# Patient Record
Sex: Female | Born: 1950
Health system: Southern US, Community
[De-identification: ages and names within clinical notes are randomized; demographics above are authoritative.]

## PROBLEM LIST (undated history)

## (undated) DIAGNOSIS — M797 Fibromyalgia: Secondary | ICD-10-CM

## (undated) DIAGNOSIS — H269 Unspecified cataract: Secondary | ICD-10-CM

## (undated) DIAGNOSIS — I10 Essential (primary) hypertension: Secondary | ICD-10-CM

## (undated) DIAGNOSIS — Q245 Malformation of coronary vessels: Secondary | ICD-10-CM

## (undated) DIAGNOSIS — K635 Polyp of colon: Secondary | ICD-10-CM

## (undated) DIAGNOSIS — I341 Nonrheumatic mitral (valve) prolapse: Secondary | ICD-10-CM

## (undated) DIAGNOSIS — K449 Diaphragmatic hernia without obstruction or gangrene: Secondary | ICD-10-CM

## (undated) DIAGNOSIS — F419 Anxiety disorder, unspecified: Secondary | ICD-10-CM

## (undated) DIAGNOSIS — K219 Gastro-esophageal reflux disease without esophagitis: Secondary | ICD-10-CM

## (undated) DIAGNOSIS — E785 Hyperlipidemia, unspecified: Secondary | ICD-10-CM

## (undated) DIAGNOSIS — E669 Obesity, unspecified: Secondary | ICD-10-CM

## (undated) DIAGNOSIS — K7689 Other specified diseases of liver: Secondary | ICD-10-CM

## (undated) DIAGNOSIS — E041 Nontoxic single thyroid nodule: Secondary | ICD-10-CM

## (undated) DIAGNOSIS — M069 Rheumatoid arthritis, unspecified: Secondary | ICD-10-CM

## (undated) DIAGNOSIS — M1990 Unspecified osteoarthritis, unspecified site: Secondary | ICD-10-CM

## (undated) HISTORY — DX: Diaphragmatic hernia without obstruction or gangrene: K44.9

## (undated) HISTORY — DX: Other specified diseases of liver: K76.89

## (undated) HISTORY — PX: TUBAL LIGATION: SHX77

## (undated) HISTORY — DX: Hyperlipidemia, unspecified: E78.5

## (undated) HISTORY — PX: VAGINAL HYSTERECTOMY: SUR661

## (undated) HISTORY — DX: Unspecified cataract: H26.9

## (undated) HISTORY — PX: RHINOPLASTY: SUR1284

## (undated) HISTORY — DX: Malformation of coronary vessels: Q24.5

## (undated) HISTORY — DX: Polyp of colon: K63.5

## (undated) HISTORY — DX: Fibromyalgia: M79.7

## (undated) HISTORY — DX: Essential (primary) hypertension: I10

## (undated) HISTORY — PX: FOOT SURGERY: SHX648

## (undated) HISTORY — DX: Gastro-esophageal reflux disease without esophagitis: K21.9

## (undated) HISTORY — DX: Nontoxic single thyroid nodule: E04.1

## (undated) HISTORY — DX: Obesity, unspecified: E66.9

## (undated) HISTORY — PX: NASAL SEPTUM SURGERY: SHX37

## (undated) HISTORY — DX: Unspecified osteoarthritis, unspecified site: M19.90

## (undated) HISTORY — DX: Nonrheumatic mitral (valve) prolapse: I34.1

## (undated) HISTORY — DX: Anxiety disorder, unspecified: F41.9

## (undated) HISTORY — DX: Rheumatoid arthritis, unspecified: M06.9

---

## 1998-11-22 ENCOUNTER — Ambulatory Visit (HOSPITAL_BASED_OUTPATIENT_CLINIC_OR_DEPARTMENT_OTHER): Admission: RE | Admit: 1998-11-22 | Discharge: 1998-11-22 | Payer: Self-pay | Admitting: Specialist

## 1998-11-27 HISTORY — PX: OTHER SURGICAL HISTORY: SHX169

## 1999-04-29 ENCOUNTER — Other Ambulatory Visit: Admission: RE | Admit: 1999-04-29 | Discharge: 1999-04-29 | Payer: Self-pay | Admitting: Gynecology

## 2000-05-11 ENCOUNTER — Other Ambulatory Visit: Admission: RE | Admit: 2000-05-11 | Discharge: 2000-05-11 | Payer: Self-pay | Admitting: Gynecology

## 2000-09-16 ENCOUNTER — Encounter (INDEPENDENT_AMBULATORY_CARE_PROVIDER_SITE_OTHER): Payer: Self-pay | Admitting: Specialist

## 2000-09-16 ENCOUNTER — Ambulatory Visit (HOSPITAL_COMMUNITY): Admission: RE | Admit: 2000-09-16 | Discharge: 2000-09-16 | Payer: Self-pay | Admitting: Gynecology

## 2000-12-20 ENCOUNTER — Encounter: Payer: Self-pay | Admitting: Gynecology

## 2000-12-27 ENCOUNTER — Inpatient Hospital Stay (HOSPITAL_COMMUNITY): Admission: RE | Admit: 2000-12-27 | Discharge: 2000-12-29 | Payer: Self-pay | Admitting: Gynecology

## 2000-12-27 ENCOUNTER — Encounter (INDEPENDENT_AMBULATORY_CARE_PROVIDER_SITE_OTHER): Payer: Self-pay | Admitting: Specialist

## 2001-05-12 ENCOUNTER — Encounter: Payer: Self-pay | Admitting: Internal Medicine

## 2001-05-12 ENCOUNTER — Encounter: Admission: RE | Admit: 2001-05-12 | Discharge: 2001-05-12 | Payer: Self-pay | Admitting: Internal Medicine

## 2002-08-04 ENCOUNTER — Encounter: Payer: Self-pay | Admitting: Internal Medicine

## 2002-08-04 ENCOUNTER — Encounter: Admission: RE | Admit: 2002-08-04 | Discharge: 2002-08-04 | Payer: Self-pay | Admitting: Internal Medicine

## 2004-04-14 ENCOUNTER — Other Ambulatory Visit: Admission: RE | Admit: 2004-04-14 | Discharge: 2004-04-14 | Payer: Self-pay | Admitting: Gynecology

## 2006-06-04 ENCOUNTER — Ambulatory Visit: Payer: Self-pay | Admitting: Internal Medicine

## 2006-06-15 ENCOUNTER — Encounter (INDEPENDENT_AMBULATORY_CARE_PROVIDER_SITE_OTHER): Payer: Self-pay | Admitting: Specialist

## 2006-06-15 ENCOUNTER — Ambulatory Visit: Payer: Self-pay | Admitting: Internal Medicine

## 2006-06-17 ENCOUNTER — Emergency Department (HOSPITAL_COMMUNITY): Admission: EM | Admit: 2006-06-17 | Discharge: 2006-06-17 | Payer: Self-pay | Admitting: Emergency Medicine

## 2006-06-23 ENCOUNTER — Ambulatory Visit: Payer: Self-pay | Admitting: Internal Medicine

## 2008-10-05 ENCOUNTER — Inpatient Hospital Stay (HOSPITAL_BASED_OUTPATIENT_CLINIC_OR_DEPARTMENT_OTHER): Admission: RE | Admit: 2008-10-05 | Discharge: 2008-10-05 | Payer: Self-pay | Admitting: Cardiology

## 2008-10-11 ENCOUNTER — Ambulatory Visit (HOSPITAL_COMMUNITY): Admission: RE | Admit: 2008-10-11 | Discharge: 2008-10-11 | Payer: Self-pay | Admitting: Cardiology

## 2009-03-08 ENCOUNTER — Encounter: Admission: RE | Admit: 2009-03-08 | Discharge: 2009-03-08 | Payer: Self-pay | Admitting: Podiatrist

## 2009-05-22 ENCOUNTER — Encounter: Admission: RE | Admit: 2009-05-22 | Discharge: 2009-05-22 | Payer: Self-pay | Admitting: Gynecology

## 2009-05-29 ENCOUNTER — Other Ambulatory Visit: Admission: RE | Admit: 2009-05-29 | Discharge: 2009-05-29 | Payer: Self-pay | Admitting: Diagnostic Radiology

## 2009-05-29 ENCOUNTER — Encounter: Admission: RE | Admit: 2009-05-29 | Discharge: 2009-05-29 | Payer: Self-pay | Admitting: Internal Medicine

## 2009-05-29 ENCOUNTER — Encounter (INDEPENDENT_AMBULATORY_CARE_PROVIDER_SITE_OTHER): Payer: Self-pay | Admitting: Diagnostic Radiology

## 2009-06-11 ENCOUNTER — Encounter: Admission: RE | Admit: 2009-06-11 | Discharge: 2009-06-11 | Payer: Self-pay | Admitting: Gynecology

## 2010-01-15 ENCOUNTER — Encounter: Admission: RE | Admit: 2010-01-15 | Discharge: 2010-01-15 | Payer: Self-pay | Admitting: Rheumatology

## 2010-05-14 ENCOUNTER — Encounter: Admission: RE | Admit: 2010-05-14 | Discharge: 2010-05-14 | Payer: Self-pay | Admitting: Internal Medicine

## 2010-06-26 ENCOUNTER — Encounter: Admission: RE | Admit: 2010-06-26 | Discharge: 2010-06-26 | Payer: Self-pay | Admitting: Gynecology

## 2010-11-17 ENCOUNTER — Other Ambulatory Visit: Payer: Self-pay | Admitting: Family Medicine

## 2010-11-18 ENCOUNTER — Ambulatory Visit
Admission: RE | Admit: 2010-11-18 | Discharge: 2010-11-18 | Disposition: A | Discharge status: Home / Self Care | Payer: Managed Care, Other (non HMO) | Source: Ambulatory Visit | Attending: Family Medicine | Admitting: Family Medicine

## 2010-11-18 DIAGNOSIS — K449 Diaphragmatic hernia without obstruction or gangrene: Secondary | ICD-10-CM

## 2010-11-18 DIAGNOSIS — K7689 Other specified diseases of liver: Secondary | ICD-10-CM

## 2010-11-18 HISTORY — DX: Other specified diseases of liver: K76.89

## 2010-11-18 HISTORY — DX: Diaphragmatic hernia without obstruction or gangrene: K44.9

## 2010-11-18 MED ORDER — IOHEXOL 300 MG/ML  SOLN
100.0000 mL | Freq: Once | INTRAMUSCULAR | Status: AC | PRN
  Administered 2010-11-18: 100 mL via INTRAVENOUS

## 2010-11-24 ENCOUNTER — Encounter (INDEPENDENT_AMBULATORY_CARE_PROVIDER_SITE_OTHER): Payer: Self-pay | Admitting: *Deleted

## 2010-12-04 NOTE — Letter (Signed)
Summary: New Patient letter  [facility]  [address] [place], [place] [address]   Phone: [phone]  Fax: [phone]       11/24/2010 MRN: [id number]  [facility] [address] [place], [place]  [address]  Dear Ms. Pigue,  Welcome to the Gastroenterology Division at [facility].    You are scheduled to see Dr.  Juanda Chance on 12-31-10 at 2:45P.M. on the 3rd floor at [facility], [address]  We ask that you try to arrive at our office 15 minutes prior to your appointment time to allow for check-in.  We would like you to complete the enclosed self-administered evaluation form prior to your visit and bring it with you on the day of your appointment.  We will review it with you.  Also, please bring a complete list of all your medications or, if you prefer, bring the medication bottles and we will list them.  Please bring your insurance card so that we may make a copy of it.  If your insurance requires a referral to see a specialist, please bring your referral form from your primary care physician.  Co-payments are due at the time of your visit and may be paid by cash, check or credit card.     Your office visit will consist of a consult with your physician (includes a physical exam), any laboratory testing he/she may order, scheduling of any necessary diagnostic testing (e.g. x-ray, ultrasound, CT-scan), and scheduling of a procedure (e.g. Endoscopy, Colonoscopy) if required.  Please allow enough time on your schedule to allow for any/all of these possibilities.    If you cannot keep your appointment, please call [phone] to cancel or reschedule prior to your appointment date.  This allows [place] the opportunity to schedule an appointment for another patient in need of care.  If you do not cancel or reschedule by 5 p.m. the business day prior to your appointment date, you will be charged a $50.00 late cancellation/no-show fee.    Thank you for choosing  [facility] for your medical needs.  We appreciate the opportunity to care for you.  Please visit [place] at our website  to learn more about our practice.                     Sincerely,                                                             The Gastroenterology Division

## 2010-12-31 ENCOUNTER — Ambulatory Visit: Payer: No Typology Code available for payment source | Admitting: Internal Medicine

## 2011-01-16 ENCOUNTER — Ambulatory Visit (INDEPENDENT_AMBULATORY_CARE_PROVIDER_SITE_OTHER): Payer: Managed Care, Other (non HMO) | Admitting: Internal Medicine

## 2011-01-16 ENCOUNTER — Encounter: Payer: Self-pay | Admitting: Internal Medicine

## 2011-01-16 VITALS — BP 120/68 | HR 68 | Ht 61.5 in | Wt 172.0 lb

## 2011-01-16 DIAGNOSIS — R1032 Left lower quadrant pain: Secondary | ICD-10-CM

## 2011-01-16 DIAGNOSIS — R933 Abnormal findings on diagnostic imaging of other parts of digestive tract: Secondary | ICD-10-CM

## 2011-01-16 MED ORDER — HYOSCYAMINE SULFATE 0.125 MG SL SUBL: SUBLINGUAL_TABLET | SUBLINGUAL | Status: DC

## 2011-01-16 NOTE — Patient Instructions (Signed)
We have sent a prescription for Levsin SL 0.125 mg. You should dissolve 1 tablet under the tongue every 6 hours as needed for abdominal pain.

## 2011-01-16 NOTE — Progress Notes (Signed)
[name] Mar 16, 1951 MRN [id number]    History of Present Illness:  This is a 60 year old white female with a several months history of left lower quadrant abdominal pain which was initially quite severe and started about 2 months ago. It has gradually improved and is almost gone now. The pain started shortly after she went on a [employer] reduction diet. She went from having normal bowel habits to developing constipation. She also was experiencing low back pain at that time. A CT scan of the abdomen and pelvis showed disc protrusion at L4-5 and a large stool burden in the colon. She denied any fever or rectal bleeding. She completed a course of Cipro and Flagyl and continued to gradually improve. She is now 90% better. She denies any urinary symptoms. We did a colonoscopy for screening in September 2007 at which time she had a hyperplastic polyp removed from the rectosigmoid colon. There is no mention of diverticulosis on my report or photo documents. A recall colonoscopy was set for 7 years. There is no mention of diverticulosis on the CT scan of the abdomen and pelvis.  Past Medical History  Diagnosis Date  . Hyperlipidemia   . Hypertension   . Anomalous left coronary artery   . MVP (mitral valve prolapse)   . Thyroid nodule     left  . Osteoarthritis   . Hepatic cyst 11/18/10  . Hiatal hernia 11/18/10  . Hyperplastic colon polyp   . Anxiety   . RA (rheumatoid arthritis)   . Fibromyalgia   . Obesity    Past Surgical History  Procedure Date  . Abdominal hysterectomy   . Cesarean section   . Nasal septum surgery   . Tubal ligation   . Foot surgery     left  . Axillary breast tissue excision 11/1998    reports that she has never smoked. She has never used smokeless tobacco. She reports that she drinks alcohol. She reports that she does not use illicit drugs. family history includes Breast cancer in her maternal aunt; Diabetes in her maternal aunt; Emphysema in her father;  Heart disease in her father; and Stroke in her mother.  There is no history of Colon cancer. Allergies  Allergen Reactions  . Lisinopril Cough        Review of Systems: Positive for abdominal pain, negative for chest pain, nausea, vomiting, and fecal incontinence, jaundice or rectal bleeding.  The remainder of the 10  point ROS is negative except as outlined in H&P   Physical Exam: General appearance  Well developed, in no distress. Eyes- non icteric. HEENT nontraumatic, normocephalic. Mouth no lesions, tongue papillated, no cheilosis. Neck supple without adenopathy, thyroid not enlarged, no carotid bruits, no JVD. Lungs Clear to auscultation bilaterally. Cor normal S1 normal S2, regular rhythm , no murmur,  quiet precordium. Abdomen soft mildly protuberant abdomen with normal active bowel sounds. Mild tenderness on deep pressure in the left lower quadrant. No ascites. Liver edge at costal margin. Rectal: Soft Hemoccult negative stool. Extremities no pedal edema. Skin no lesions. Neurological alert and oriented x 3. Psychological normal mood and affect.  Assessment and Plan:  Problem #1: Left lower quadrant abdominal pain now 90% resolved. Her symptoms are suggestive of diverticulitis but no diverticulitis was seen on a recent colonoscopy or CT scan of the abdomen. I suspect her symptoms are related to either irritable bowel syndrome or functional constipation. Her symptoms coincided with the change in her diet. We have discussed the treatment  of constipation. I have put her on Metamucil 1 heaping teaspoon daily. She will also take Levsin sublingually 0.125 mg when necessary for abdominal pain. She would be due for a recall colonoscopy in September 2014 or earlier if her symptoms continue to recur.  Problem #2: Patient has a history of a hyperplastic polyp of the colon. She is due for a recall colonoscopy in September 2014.   01/16/2011 [name]

## 2011-02-10 NOTE — Cardiovascular Report (Signed)
[name], [name]              ACCOUNT NO.:  [id number]   MEDICAL RECORD NO.:  [id number]          PATIENT TYPE:  OIB   LOCATION:  1965                         FACILITY:  [facility]   PHYSICIAN:  Donna Bathe, MD      DATE OF BIRTH:  10/19/1950   DATE OF PROCEDURE:  DATE OF DISCHARGE:  10/05/2008                            CARDIAC CATHETERIZATION   PRIMARY CARE PHYSICIAN:  Donna Hazel, MD   PROCEDURES:  1. Left heart catheterization  2. Selective coronary angiography.  3. Left ventriculogram.   INDICATIONS:  A 60 year old female with chest pain, described as hit me  all of a sudden, usually nonexertional, but increasing in frequency,  does feel a heaviness in her chest, 7/10 in intensity, which radiates  across to both arms bilaterally.  First time it happened when she was  sitting on the couch.  No prior CAD history.  A nuclear stress test was  performed, which showed an abnormal segment in the mid anteroseptal  wall, consistent with possible ischemia.  She also had 1.5-2 mm ST  depression in the lateral leads on her treadmill test.  Her  echocardiogram showed normal ejection fraction and diastolic  dysfunction.   PROCEDURE DETAILS:  Informed consent was obtained.  Risks of stroke,  heart attack, death were explained to the patient at length as well as  bleeding.  She was placed on the catheterization table and prepped in a  sterile fashion.  A 1% lidocaine was used for local anesthesia.  The  femoral head was visualized with fluoroscopy.  A Judkins 4 catheter was  then used to attempt selection of the left main artery.  The left main  artery was quite posterior margin.  A Judkins left 3.5 was then  utilized, which did allow for clear visualization of the ostium of the  left main.  An AL1 catheter was attempted but did not provide any  further benefit.  A multipurpose catheter was placed into the aorta but  was never utilized.  Dr. Verdis Ingram was used in consultation for a  suggestion on obtaining the posterior ostium.  Finally, a Judkins 3.5  catheter was used to visualize the left main ostium.  Multiple views  with a hand injection of Omnipaque were obtained.  This catheter was  then exchanged for a Judkins no-torque right catheter, which was used to  selectively cannulate the right coronary artery.  Multiple views with  hand injection of Omnipaque were obtained.  The angled pigtail was used  to cross into the left ventricle via the aortic valve.  Hemodynamics  were obtained.  A 30 mL of contrast with power injection was used in the  RAO position for a left ventriculogram.  Following this, the catheter  was pulled back across the aortic valve, and hemodynamics were obtained.  Following the procedure, the findings were discussed with the patient at  length.   FINDINGS:  1. Left main artery - quite posterior takeoff, a long left main      segment, which branches into the LAD and the circumflex artery.      There  does not appear to be any angiographically significant      coronary artery disease present.  There are 2 diagonal branches.      No significant disease.  2. Left circumflex artery - this is a relatively small-caliber vessel.      There is 1 obtuse marginal branch.  No significant disease present.      There are 2 obtuse marginal branches.  3. Right coronary artery - this is the dominant vessel giving rise to      the posterior descending artery.  There is no angiographically      significant disease.  4. Left ventriculogram - normal ejection fraction of 55-60% with no      wall motion abnormalities.  During the first beat, there is mitral      regurgitation present, which is 1+ and maybe secondary to catheter-      induced mitral regurgitation.   HEMODYNAMICS:  Left ventricular pressure is 134 with an end-diastolic  pressure of 18 mmHg.  Aortic pressure is 134/50 with a mean of 84.  There is no gradient between the left ventricle and the aortic  valve.   IMPRESSION:  1. No angiographically significant coronary artery disease.  2. Posterior takeoff of the left main artery with long left main      branch, giving rise to both the left anterior descending and the      circumflex.  Anomalous coronary artery.  3. Normal ejection fraction with no wall motion abnormality.  Likely      catheter-induced mitral regurgitation present.  4. Mildly elevated left ventricular end-diastolic pressure of 18 mmHg.   PLAN:  I will obtain a CT angiogram of her coronary arteries given her  anomalous origin of the left main to obtain the anatomical pathway of  the left main artery.  This will help to rule out possibility of  traversing between the pulmonary artery and the aorta.  Thus far  reassurance given no evidence of coronary artery disease.  We will  continue with aggressive risk factor modification.  I will see Ms.  Ingram back in clinic following CT scan.      Donna Bathe, MD  Electronically Signed     MCS/MEDQ  D:  10/05/2008  T:  10/05/2008  Job:  [id number]   cc:   [name], M.D.

## 2011-02-13 NOTE — H&P (Signed)
[facility]  Patient:    [name], [name]              MRN: [id number] Adm. Date:  [id number] Attending:  Susa Raring                         History and Physical  This patient is a 60 year old gravida 3, para 1, abortus 2 with one living child admitted to the hospital because of continued complaints of heavy vaginal bleeding with her periods that are also prolonged and painful.  She also complains of chronic pelvic pain, dyspareunia, and feeling that her bottom is falling out all the time.  She has had these symptoms including the bleeding for some time.  Hemoglobin ranges between 10-12 depending on whether or not she is taking her iron.  She had a D&C in December 2001 with some transient improvement in her periods.  Attempts to control her flow and her cycles with progestins and with oral contraceptives have been unsuccessful. Patient had worsening problems with this and she now wants a more definitive therapy.  Options discussed with her were possible endometrial ablation by either thermal or cryotherapy and also vaginal hysterectomy.  Patient desires to have permanent cessation of her periods.  She has previously had a sterilization procedure and desires no further children, particularly at her age.  She is thus admitted for a hysterectomy for definitive treatment.  PAST MEDICAL HISTORY:  The patient has had three pregnancies.  She has one living child.  She has had two pregnancies aborted.  She has had a history of what she thought was mitral valve prolapse but it turned out to be a mild mitral regurgitation with no evidence of mitral valve prolapse.  She had tubal sterilization.  She had no other surgery.  She has had no serious medical illnesses.  She takes Zoloft sometimes for depression.  She also takes Atenolol from time to time because of irregular heartbeat.  Last Pap smear done last fall was benign.  Mammograms were benign.   Tissue diagnosis from her D&C in December was benign, showed proliferative endometrium and some endometrial polyp.  FAMILY HISTORY:  There is no history of bleeding disorders, tuberculosis, or diabetes.  She had a grandmother that had breast cancer and a cousin that had breast cancer.  REVIEW OF SYSTEMS:  HEENT:  No complaints.  CARDIORESPIRATORY:  No complaints. GASTROINTESTINAL:  No complaints.  NEUROMUSCULAR:  Complains of fatigue. GENITOURINARY:  See present illness.  PHYSICAL EXAMINATION  GENERAL:  This is a well-nourished, well-developed 60 year old female in no acute distress.  VITAL SIGNS:  Weight 5 feet 2 inches, weight 145 pounds, blood pressure 130/70, pulse 72 and regular, respirations 12 per minute.  HEENT:  Pupils are equal, react to light and accommodation.  Ears, nose, and throat:  Normal.  NECK:  Thyroid:  Normal size.  HEART:  Rhythm regular.  I can hear no murmurs at the present time.  LUNGS:  Clear to auscultation and percussion.  BREASTS:  No palpable masses.  ABDOMEN:  Soft and flat.  Liver, kidneys, and spleen are not enlarged.  There are no palpable masses in the abdomen.  SKIN:  Smooth and dry.  EXTREMITIES:  No deformity.  No limitation of motion.  Peripheral pulses are present bilaterally.  NEUROLOGIC:  Physiologic.  PELVIC:  External genitalia:  Normal with a parous outlet.  The vagina is lined with healthy mucosa.  Bladder and rectum are  fairly well supported. Cervix is smooth, prolapsed nearly to the introitus.  The uterus is anterior, slightly enlarged, and irregular, tender to palpation and movement.  I cannot appreciated any separate adnexal masses and no masses down in the cul-de-sac. Rectal examination is negative for masses and also Hemoccult negative.  IMPRESSION: 1. Persistent menorrhagia. 2. Chronic pelvic pain, dysmenorrhea, dyspareunia. 3. Uterine prolapse. 4. History of mild mitral regurgitation. 5. Postoperative status  bilateral tubal ligation.  PLAN/MANAGEMENT:  Patient will be taken to the operating room and examined under anesthesia.  It is felt that her uterus is small enough and prolapsed enough that this uterus could be removed vaginally, especially since she does not want her ovaries removed.  She expressly states that she wants them to remain in situ so she does not have to start taking hormones immediately. Patient understands that she will no longer have periods, will no longer be able to bear children.  She understands the complications of this procedure including, but not limited to, bleeding, infection, injury to adjacent structures, and death.  She agrees to undergo this procedure. DD:  12/27/00 TD:  12/27/00 Job: [id number]

## 2011-02-13 NOTE — Discharge Summary (Signed)
[facility]  Patient:    [name], [name]              MRN: [id number] Adm. Date:  [id number] Disc. Date: [id number] Attending:  Susa Raring                           Discharge Summary  HISTORY OF PRESENT ILLNESS:  This patient is a 60 year old, gravida 3, para 1, abortus 2, admitted to the hospital with heavy painful periods that are prolonged.  She also complains of chronic pelvic pain and dyspareunia.  She has feelings that her bottom is falling out all the time.  She has had documented borderline anemia for the past year.  Hemoglobins have ranged between 10 and 12.  She had a D&C in December 2001 with some transient improvement in her periods; however, this was short-lived.  She is now admitted for definitive therapy.  PHYSICAL EXAMINATION:  Pertinent findings on physical exam are pretty much limited to the pelvis.  She has a normal external genitalia.  She has a parous outlet.  The vagina is lined with healthy mucosa.  The bladder and rectum are fairly well supported.  Cervix was smooth, was prolapsed nearly to the introitus.  The uterus is anterior, slightly enlarged and irregular, tender to palpation and movement.  No adnexal masses can be felt.  There are no masses in the cul-de-sac.  LABORATORY DATA:  Preoperative hemoglobin 12.5, hematocrit 36.  White count and differentials were normal.  Her postoperative hemoglobin was 11.8 with a hematocrit of 35.   Electrolytes were normal.  A urine pregnancy test was negative.  Voided specimen for urinalysis was normal.  A preoperative electrocardiogram showed some sinus bradycardia, nonspecific ST changes, no change since her previous tracing.  Chest film showed no evidence of disease.  HOSPITAL COURSE:  Patient was taken to the operating room where total vaginal hysterectomy was done without complication.  Blood loss was approximately 100 cc.  None was replaced.  Patients postoperative  course was unremarkable. She did run a little low-grade temperature on the first postoperative day, but this spontaneously resolved.  She had some coarse breath sounds postoperatively, but these resolved spontaneously with incentive spirometry. By December 29, 2000, the patient was afebrile, taking p.o. nourishment, was passing gas, had no excess bleeding, was therefore discharged to her home. She was instructed to eat a regular diet, to have limited activity, no vaginal penetration, and to return to my office in four weeks time for follow-up care.  She was given a prescription for [facility] for pain to be taken as needed.  For less severe pain, she was to use Advil or Aleve.  Examination of this surgical specimen showed extensive adenomyosis and no evidence of any malignancy.  FINAL DIAGNOSES: 1. Chronic pelvic pain, dysmenorrhea, and dyspareunia. 2. Menorrhagia. 3. Uterine prolapse. 4. Adenomyosis of the uterus.  OPERATION:  Total vaginal hysterectomy.  CONDITION ON DISCHARGE:  Improved. DD:  01/18/01 TD:  01/18/01 Job: [id number]

## 2011-02-13 NOTE — Op Note (Signed)
[facility]  Patient:    [name], [name]                     MRN: [id number] Proc. Date: 09/16/00 Adm. Date:  [id number] Attending:  Susa Raring                           Operative Report  PREOPERATIVE DIAGNOSIS:  Menorrhagia unresponsive to hormonal therapy.  POSTOPERATIVE DIAGNOSES: 1. Menorrhagia unresponsive to hormonal therapy. 2. Endometrial polyp. 3. Small submucous fibroid.  OPERATION:  Dilatation and curettage hysteroscopy.  SURGEON:  Luvenia Redden, M.D.  DESCRIPTION OF PROCEDURE:  Under good sedation, the patient was in the lithotomy position.  She was prepped and draped in a sterile manner.  The cervix was grasped with tenaculum.  Paracervical block was performed by injecting 10 cc of 1% lidocaine at the 4 and 8 oclock positions paracervically.  The uterus was then sounded to a depth of 3-1/2 inches.  The cervix was dilated to 26.  Scope was placed in the endocervical canal.   Using Sorbitol as a distending medium, the endocervix was viewed and appeared to be normal.  On viewing the endometrial cavity, there a couple of small polyps that were seen extending off of the endometrium.  Ostia reviewed and appeared normal.  There appeared to be on the posterior wall of the cervix a distortion caused by a small submucous fibroid.  No other pathology could be viewed.  The scope was removed, and then the cervical curettage was done, and this was sent as a separate specimen.  Endometrial cavity was explored with the polyp forceps, and a small amount of tissue was obtained.  The endometrial cavity was scraped vigorously with a curet, and a small to moderate amount of tissue was obtained.  This was sent to pathology for examination.  The endometrial cavity was then wiped with a dry sponge, and the procedure was terminated. Estimated blood loss was 25 cc.  There was no fluid deficit at the end of the procedure.  The patient tolerated the  procedure well and was removed to the recovery room in good condition. DD:  09/16/00 TD:  09/17/00 Job: [id number]

## 2011-02-13 NOTE — Op Note (Signed)
[facility]  Patient:    [name], [name]              MRN: [id number] Proc. Date: 12/27/00 Adm. Date:  [id number] Attending:  Susa Raring                           Operative Report  PREOPERATIVE DIAGNOSES: 1. Persistent menorrhagia. 2. Chronic pelvic pain and dyspareunia. 3. Uterine prolapse.  POSTOPERATIVE DIAGNOSES: 1. Persistent menorrhagia. 2. Chronic pelvic pain and dyspareunia. 3. Uterine prolapse.  OPERATION:  Total vaginal hysterectomy.  SURGEON:  Luvenia Redden, M.D.  ASSISTANT:  Brook A. Edward Jolly, M.D.  ANESTHESIA:  General endotracheal.  DESCRIPTION OF PROCEDURE:  Under good anesthesia, the patient prepped and draped in a sterile manner and examined.  The uterus was upper limits of normal size.  Under anesthesia, the cervix presented at the introitus.  There were no palpable pelvic masses.  Since the patient did not want to have her ovaries removed, it was determined that hysterectomy could be accomplished vaginally.  The cervix was grasped with a tenaculum.  Paracervical tissues infiltrated with 0.5% Marcaine with Neo-Synephrine.  Cervix was circumcised and mucosa dissected away by sharp and blunt dissection.  The peritoneum was entered posteriorly.  The bladder was dissected off the lower uterine segment and cervix by blunt dissection.  Uterosacral ligaments were clamped, cut, and ligated.  The cardinal ligaments were clamped, cut, and ligated in two bites. Peritoneum was entered anteriorly.  Broad ligaments, including the uterine vessels, were clamped, cut, and ligated in three bites.  Fundus was delivered through the vaginal incision.  Fallopian tubes, uteroovarian ligaments, and round ligaments were clamped, cut, and these highest stumps were doubly ligated and tagged.  The stumps were inspected, and there was no active bleeding.  Ovaries were inspected and were normal.  Posterior cuff was run with a locked  continuous Vicryl for hemostasis.  After all the counts were correct, the peritoneum was closed with a pursestring suture of Monocryl.  The vaginal incision was then closed using a continuous Vicryl.  There was no active bleeding.  There was clear urine coming from the Foley catheter in the bladder at the end of the procedure.  Estimated blood loss was reported as 100 cc.  None was replaced.  The patient tolerated the procedure nicely and was removed to recovery in good condition. DD:  12/27/00 TD:  12/27/00 Job: [id number]

## 2011-03-24 ENCOUNTER — Other Ambulatory Visit: Payer: Self-pay | Admitting: Internal Medicine

## 2011-03-24 DIAGNOSIS — E042 Nontoxic multinodular goiter: Secondary | ICD-10-CM

## 2011-03-25 ENCOUNTER — Ambulatory Visit
Admission: RE | Admit: 2011-03-25 | Discharge: 2011-03-25 | Disposition: A | Discharge status: Home / Self Care | Payer: Managed Care, Other (non HMO) | Source: Ambulatory Visit | Attending: Internal Medicine | Admitting: Internal Medicine

## 2011-03-25 DIAGNOSIS — E042 Nontoxic multinodular goiter: Secondary | ICD-10-CM

## 2011-03-29 ENCOUNTER — Emergency Department (HOSPITAL_COMMUNITY): Payer: Managed Care, Other (non HMO)

## 2011-03-29 ENCOUNTER — Emergency Department (HOSPITAL_COMMUNITY)
Admission: EM | Admit: 2011-03-29 | Discharge: 2011-03-29 | Disposition: A | Discharge status: Home / Self Care | Payer: Managed Care, Other (non HMO) | Attending: Emergency Medicine | Admitting: Emergency Medicine

## 2011-03-29 DIAGNOSIS — R05 Cough: Secondary | ICD-10-CM | POA: Insufficient documentation

## 2011-03-29 DIAGNOSIS — J069 Acute upper respiratory infection, unspecified: Secondary | ICD-10-CM | POA: Insufficient documentation

## 2011-03-29 DIAGNOSIS — F411 Generalized anxiety disorder: Secondary | ICD-10-CM | POA: Insufficient documentation

## 2011-03-29 DIAGNOSIS — E789 Disorder of lipoprotein metabolism, unspecified: Secondary | ICD-10-CM | POA: Insufficient documentation

## 2011-03-29 DIAGNOSIS — R1110 Vomiting, unspecified: Secondary | ICD-10-CM | POA: Insufficient documentation

## 2011-03-29 DIAGNOSIS — R059 Cough, unspecified: Secondary | ICD-10-CM | POA: Insufficient documentation

## 2011-03-29 DIAGNOSIS — Z79899 Other long term (current) drug therapy: Secondary | ICD-10-CM | POA: Insufficient documentation

## 2011-03-29 DIAGNOSIS — I10 Essential (primary) hypertension: Secondary | ICD-10-CM | POA: Insufficient documentation

## 2011-03-29 LAB — POCT I-STAT, CHEM 8
Creatinine, Ser: 0.7 mg/dL (ref 0.50–1.10)
Glucose, Bld: 120 mg/dL — ABNORMAL HIGH (ref 70–99)
Hemoglobin: 14.3 g/dL (ref 12.0–15.0)
Potassium: 3.9 mEq/L (ref 3.5–5.1)

## 2011-03-29 LAB — CK TOTAL AND CKMB (NOT AT ARMC): Relative Index: INVALID (ref 0.0–2.5)

## 2011-03-29 LAB — TROPONIN I: Troponin I: <0.3 ng/mL (ref <0.30)

## 2011-03-29 LAB — PRO B NATRIURETIC PEPTIDE: Pro B Natriuretic peptide (BNP): 31.9 pg/mL (ref 0–125)

## 2011-04-29 ENCOUNTER — Encounter (INDEPENDENT_AMBULATORY_CARE_PROVIDER_SITE_OTHER): Payer: Managed Care, Other (non HMO) | Admitting: Surgery

## 2011-08-11 ENCOUNTER — Other Ambulatory Visit: Payer: Self-pay | Admitting: Gynecology

## 2011-08-11 DIAGNOSIS — Z1231 Encounter for screening mammogram for malignant neoplasm of breast: Secondary | ICD-10-CM

## 2011-08-12 ENCOUNTER — Other Ambulatory Visit: Payer: Self-pay | Admitting: Family Medicine

## 2011-09-01 ENCOUNTER — Encounter: Payer: Self-pay | Admitting: Internal Medicine

## 2011-09-04 ENCOUNTER — Ambulatory Visit
Admission: RE | Admit: 2011-09-04 | Discharge: 2011-09-04 | Disposition: A | Discharge status: Home / Self Care | Payer: Managed Care, Other (non HMO) | Source: Ambulatory Visit | Attending: Gynecology | Admitting: Gynecology

## 2011-09-04 DIAGNOSIS — Z1231 Encounter for screening mammogram for malignant neoplasm of breast: Secondary | ICD-10-CM

## 2011-09-09 ENCOUNTER — Other Ambulatory Visit: Payer: Managed Care, Other (non HMO)

## 2011-10-13 ENCOUNTER — Ambulatory Visit
Admission: RE | Admit: 2011-10-13 | Discharge: 2011-10-13 | Disposition: A | Discharge status: Home / Self Care | Payer: Managed Care, Other (non HMO) | Source: Ambulatory Visit | Attending: Family Medicine | Admitting: Family Medicine

## 2013-03-17 ENCOUNTER — Ambulatory Visit (INDEPENDENT_AMBULATORY_CARE_PROVIDER_SITE_OTHER): Payer: No Typology Code available for payment source | Admitting: Internal Medicine

## 2013-03-17 ENCOUNTER — Encounter: Payer: Self-pay | Admitting: Internal Medicine

## 2013-03-17 VITALS — BP 128/72 | HR 80 | Ht 61.0 in | Wt 171.1 lb

## 2013-03-17 DIAGNOSIS — K219 Gastro-esophageal reflux disease without esophagitis: Secondary | ICD-10-CM

## 2013-03-17 DIAGNOSIS — Z1211 Encounter for screening for malignant neoplasm of colon: Secondary | ICD-10-CM

## 2013-03-17 MED ORDER — PEG-KCL-NACL-NASULF-NA ASC-C 100 G PO SOLR: 1.0000 | Freq: Once | ORAL | Status: DC

## 2013-03-17 MED ORDER — SUCRALFATE 1 GM/10ML PO SUSP: 1.0000 g | Freq: Two times a day (BID) | ORAL | Status: DC

## 2013-03-17 MED ORDER — OMEPRAZOLE 40 MG PO CPDR: 40.0000 mg | DELAYED_RELEASE_CAPSULE | Freq: Two times a day (BID) | ORAL | Status: DC

## 2013-03-17 NOTE — Progress Notes (Signed)
[name] 25-Jun-1951 MRN [id number]        History of Present Illness:  This is a 62 year old white female with severe gastroesophageal reflux refractory to PPIs. She is currently not taking any acid reducing agents other than TUMS. We have seen her in September 2007 for screening colonoscopy. She was found to have a hyperplastic polyp at 15 cm and is due for repeat colonoscopy at this time. She denies any lower GI problems. She describes waking up at night with food and acid backing up into her esophagus. She eats at 5 PM but vomits in the middle of the night. She denies hematemesis. There's occasions with dysphagia. Upper GI series in January 2013 for dysphagia a showed moderate tertiary contractions. Hiatal hernia with moderate gastroesophageal reflux. Barium tablet passed to be the delay. CT scan of the abdomen in February 2012 confirmed hiatal hernia as well as some liver cysts and increased stool burden in the right colon.   Past Medical History  Diagnosis Date  . Hyperlipidemia   . Hypertension   . Anomalous left coronary artery   . MVP (mitral valve prolapse)   . Thyroid nodule     left  . Osteoarthritis   . Hepatic cyst 11/18/10  . Hiatal hernia 11/18/10  . Hyperplastic colon polyp   . Anxiety   . RA (rheumatoid arthritis)   . Fibromyalgia   . Obesity    Past Surgical History  Procedure Laterality Date  . Abdominal hysterectomy    . Cesarean section    . Nasal septum surgery    . Tubal ligation    . Foot surgery Left   . Axillary breast tissue excision  11/1998    reports that she has never smoked. She has never used smokeless tobacco. She reports that  drinks alcohol. She reports that she does not use illicit drugs. family history includes Breast cancer in her maternal aunt; Diabetes in her maternal aunt; Emphysema in her father; Heart disease in her father; and Stroke in her mother.  There is no history of Colon cancer. Allergies  Allergen Reactions  .  Lisinopril Cough        Review of Systems: Positive for dysphagia. Chest pain regurgitation  The remainder of the 10 point ROS is negative except as outlined in H&P   Physical Exam: General appearance  Well developed, in no distress. Overweight Eyes- non icteric. HEENT nontraumatic, normocephalic. Normal voice. No cough Mouth no lesions, tongue papillated, no cheilosis. Neck supple without adenopathy, thyroid not enlarged, no carotid bruits, no JVD. Lungs Clear to auscultation bilaterally. Cor normal S1, normal S2, regular rhythm, no murmur,  quiet precordium. Abdomen: Soft obese with tenderness in subtotal foot area and midline. Lower abdomen unremarkable. No distention Rectal: Not done Extremities no pedal edema. Skin no lesions. Neurological alert and oriented x 3. Psychological normal mood and affect.  Assessment and Plan:  62 year old white female with chronic gastroesophageal reflux and esophageal dysmotility described on the upper GI series in 2013. She is not being treated at this time and will have to get on Prilosec 40 mg twice a day and strict antireflux measures which will include weight loss. We will also add  Carafate slurry 10 cc by mouth twice a day for coating affect. We have discussed  antireflux measures including weight loss. Since she is complaining of old food regurgitation she may need to be evaluated for gastroparesis with gastric emptying scan  Colorectal screening history of a hyperplastic colon polyp in  September 2007. She will be due for recall colonoscopy in September 2014. We will schedule it for the same day with upper endoscopy   03/17/2013 [name]

## 2013-03-17 NOTE — Patient Instructions (Addendum)
We have sent the following medications to your pharmacy for you to pick up at your convenience: Prilosec and Carafate.  You have been scheduled for an endoscopy and colonoscopy with propofol. Please follow the written instructions given to you at your visit today. Please pick up your prep at the pharmacy within the next 1-3 days. If you use inhalers (even only as needed), please bring them with you on the day of your procedure. Your physician has requested that you go to www.startemmi.com and enter the access code given to you at your visit today. This web site gives a general overview about your procedure. However, you should still follow specific instructions given to you by our office regarding your preparation for the procedure.   Cc: Dr Sigmund Hazel

## 2013-03-20 ENCOUNTER — Encounter: Payer: Self-pay | Admitting: Internal Medicine

## 2013-04-19 ENCOUNTER — Encounter: Payer: Self-pay | Admitting: Internal Medicine

## 2013-04-19 ENCOUNTER — Ambulatory Visit (AMBULATORY_SURGERY_CENTER): Payer: No Typology Code available for payment source | Admitting: Internal Medicine

## 2013-04-19 ENCOUNTER — Other Ambulatory Visit: Payer: Self-pay | Admitting: *Deleted

## 2013-04-19 ENCOUNTER — Telehealth: Payer: Self-pay | Admitting: *Deleted

## 2013-04-19 VITALS — BP 133/53 | HR 80 | Temp 98.0°F | Resp 15 | Ht 61.0 in | Wt 171.0 lb

## 2013-04-19 DIAGNOSIS — R1310 Dysphagia, unspecified: Secondary | ICD-10-CM

## 2013-04-19 DIAGNOSIS — Z1211 Encounter for screening for malignant neoplasm of colon: Secondary | ICD-10-CM

## 2013-04-19 DIAGNOSIS — K224 Dyskinesia of esophagus: Secondary | ICD-10-CM

## 2013-04-19 DIAGNOSIS — R1319 Other dysphagia: Secondary | ICD-10-CM

## 2013-04-19 DIAGNOSIS — Z86010 Personal history of colonic polyps: Secondary | ICD-10-CM

## 2013-04-19 MED ORDER — ESOMEPRAZOLE MAGNESIUM 40 MG PO CPDR: 40.0000 mg | DELAYED_RELEASE_CAPSULE | Freq: Two times a day (BID) | ORAL | Status: DC

## 2013-04-19 MED ORDER — SODIUM CHLORIDE 0.9 % IV SOLN: 500.0000 mL | INTRAVENOUS | Status: DC

## 2013-04-19 MED ORDER — METOCLOPRAMIDE HCL 5 MG PO TABS: 5.0000 mg | ORAL_TABLET | Freq: Every day | ORAL | Status: DC

## 2013-04-19 NOTE — Patient Instructions (Addendum)
Discharge instructions given with verbal understanding. Handout on a hiatal hernia and a dilatation diet given. Resume previous medications. YOU HAD AN ENDOSCOPIC PROCEDURE TODAY AT THE [facility]: Refer to the procedure report that was given to you for any specific questions about what was found during the examination.  If the procedure report does not answer your questions, please call your gastroenterologist to clarify.  If you requested that your care partner not be given the details of your procedure findings, then the procedure report has been included in a sealed envelope for you to review at your convenience later.  YOU SHOULD EXPECT: Some feelings of bloating in the abdomen. Passage of more gas than usual.  Walking can help get rid of the air that was put into your GI tract during the procedure and reduce the bloating. If you had a lower endoscopy (such as a colonoscopy or flexible sigmoidoscopy) you may notice spotting of blood in your stool or on the toilet paper. If you underwent a bowel prep for your procedure, then you may not have a normal bowel movement for a few days.  DIET: Your first meal following the procedure should be a light meal and then it is ok to progress to your normal diet.  A half-sandwich or bowl of soup is an example of a good first meal.  Heavy or fried foods are harder to digest and may make you feel nauseous or bloated.  Likewise meals heavy in dairy and vegetables can cause extra gas to form and this can also increase the bloating.  Drink plenty of fluids but you should avoid alcoholic beverages for 24 hours.  ACTIVITY: Your care partner should take you home directly after the procedure.  You should plan to take it easy, moving slowly for the rest of the day.  You can resume normal activity the day after the procedure however you should NOT DRIVE or use heavy machinery for 24 hours (because of the sedation medicines used during the test).    SYMPTOMS TO  REPORT IMMEDIATELY: A gastroenterologist can be reached at any hour.  During normal business hours, 8:30 AM to 5:00 PM Monday through Friday, call [phone].  After hours and on weekends, please call the GI answering service at [phone] who will take a message and have the physician on call contact you.   Following lower endoscopy (colonoscopy or flexible sigmoidoscopy):  Excessive amounts of blood in the stool  Significant tenderness or worsening of abdominal pains  Swelling of the abdomen that is new, acute  Fever of 100F or higher  Following upper endoscopy (EGD)  Vomiting of blood or coffee ground material  New chest pain or pain under the shoulder blades  Painful or persistently difficult swallowing  New shortness of breath  Fever of 100F or higher  Black, tarry-looking stools  FOLLOW UP: If any biopsies were taken you will be contacted by phone or by letter within the next 1-3 weeks.  Call your gastroenterologist if you have not heard about the biopsies in 3 weeks.  Our staff will call the home number listed on your records the next business day following your procedure to check on you and address any questions or concerns that you may have at that time regarding the information given to you following your procedure. This is a courtesy call and so if there is no answer at the home number and we have not heard from you through the emergency physician on call, we  will assume that you have returned to your regular daily activities without incident.  SIGNATURES/CONFIDENTIALITY: You and/or your care partner have signed paperwork which will be entered into your electronic medical record.  These signatures attest to the fact that that the information above on your After Visit Summary has been reviewed and is understood.  Full responsibility of the confidentiality of this discharge information lies with you and/or your care-partner.

## 2013-04-19 NOTE — Op Note (Signed)
[facility] [address] [place], [address]   COLONOSCOPY PROCEDURE REPORT  PATIENT: [name], [name]  MR#: [id number] BIRTHDATE: 1951-05-21 , 61  yrs. old GENDER: Female ENDOSCOPIST: Hart Carwin, MD REFERRED BY:  Leda Quail, M.D. PROCEDURE DATE:  04/19/2013 PROCEDURE:   Colonoscopy, screening ASA CLASS:   Class II INDICATIONS:Average risk patient for colon cancer and hyperplastic polyp on colonoscopy in 2007. MEDICATIONS: MAC sedation, administered by CRNA and propofol (Diprivan) 100mg  IV  DESCRIPTION OF PROCEDURE:   After the risks and benefits and of the procedure were explained, informed consent was obtained.  A digital rectal exam revealed no abnormalities of the rectum.    The LB [id number]  endoscope was introduced through the anus and advanced to the cecum, which was identified by both the appendix and ileocecal valve .  The quality of the prep was good, using MoviPrep .  The instrument was then slowly withdrawn as the colon was fully examined.     COLON FINDINGS: A normal appearing cecum, ileocecal valve, and appendiceal orifice were identified.  The ascending, hepatic flexure, transverse, splenic flexure, descending, sigmoid colon and rectum appeared unremarkable.  No polyps or cancers were seen. Retroflexed views revealed no abnormalities.     The scope was then withdrawn from the patient and the procedure completed.  COMPLICATIONS: There were no complications. ENDOSCOPIC IMPRESSION: Normal colon  RECOMMENDATIONS: High fiber diet   REPEAT EXAM: In 10 year(s)  for Colonoscopy.  cc:  _______________________________ eSignedHart Carwin, MD 04/19/2013 3:23 PM

## 2013-04-19 NOTE — Telephone Encounter (Signed)
Per Dr. Juanda Chance: needs GES. Scheduled at [facility] 2 radiology on 05/01/13 at 7:00 AM(Cheryl) NPO after midnight. No stomach meds 8 hours prior. Celia in [facility] recovery given date, time and instructions to give patient.

## 2013-04-19 NOTE — Progress Notes (Signed)
Called to room to assist during endoscopic procedure.  Patient ID and intended procedure confirmed with present staff. Received instructions for my participation in the procedure from the performing physician.  

## 2013-04-19 NOTE — Op Note (Addendum)
[facility] [address] [place], [address]   ENDOSCOPY PROCEDURE REPORT  PATIENT: Donna, Ingram  MR#: [id number] BIRTHDATE: 1951/04/18 , 61  yrs. old GENDER: Female ENDOSCOPIST: Hart Carwin, MD REFERRED BY:  Sigmund Hazel, M.D. PROCEDURE DATE:  04/19/2013 PROCEDURE:  EGD, diagnostic and Maloney dilation of esophagus ASA CLASS:     Class II INDICATIONS:  Heartburn.   follow up of GERD, nocturnal regurgitation refractory to PPI,  2009 EGD , known esophageal dismotility as per UGI series 2013- tertiary contractions. MEDICATIONS: propofol (Diprivan) 300mg  IV TOPICAL ANESTHETIC: Cetacaine Spray  DESCRIPTION OF PROCEDURE: After the risks benefits and alternatives of the procedure were thoroughly explained, informed consent was obtained.  The LB [id number] endoscope was introduced through the mouth and advanced to the second portion of the duodenum. Without limitations.  The instrument was slowly withdrawn as the mucosa was fully examined.      esophagus: Esophageal mucosa appeared normal in the proximal mid and distal esophagus. That there was a mild nonobstructing esophageal stricture which allowed the endoscope to traverse without assistance there was no evidence of esophagitis. Z line appeared normal  Stoma there was a 4 cm nonreducible hiatal hernia extending from 31-35 cm from the incisors there were no Cameron erosions. Gastric antrum and gastric outlet were unremarkable. There was no evidence of retained food. Retroflexion of the scope revealed normal fundus and cardia  Duodenum duodenal bulb and descending duodenum was normal Maloney dilators 46 French passed through the angulated esophagus a hiatal hernia with some difficulty with some resistance met at the level of the hiatal hernia. At that point procedure was terminated[         The scope was then withdrawn from the patient and the procedure completed.  COMPLICATIONS: There were  no complications. ENDOSCOPIC IMPRESSION:  1.moderate-sized hiatal hernia extending from 31-35 cm from the incisors. With mild nonobstructing esophageal ring that is post passage of 46 [place] Maloney dilator 2. Known esophageal dysmotility. RECOMMENDATIONS: Anti-reflux regimen to be follow Switch to Nexiem 40 mg po bid, no food or drink for 2 hours prior to bedtime Reglan 5mg  po qhs consider Nissen Fundoplication in primary motility improves ( may need to do es. manometry) Gastric emptying scan  REPEAT EXAM: no  eSigned:  Hart Carwin, MD 04/19/2013 3:26 PM Revised: 04/19/2013 3:26 PM  CC:  PATIENT NAME:  Donna, Ingram MR#: [id number]

## 2013-04-21 ENCOUNTER — Telehealth: Payer: Self-pay

## 2013-04-21 NOTE — Telephone Encounter (Signed)
  Follow up Call-  Call back number 04/19/2013  Post procedure Call Back phone  # [phone]  Permission to leave phone message Yes     Patient questions:  Do you have a fever, pain , or abdominal swelling? no Pain Score  0 *  Have you tolerated food without any problems? yes  Have you been able to return to your normal activities? yes  Do you have any questions about your discharge instructions: Diet   no Medications  no Follow up visit  no  Do you have questions or concerns about your Care? no  Actions: * If pain score is 4 or above: No action needed, pain <4.

## 2013-05-01 ENCOUNTER — Encounter (HOSPITAL_COMMUNITY)
Admission: RE | Admit: 2013-05-01 | Discharge: 2013-05-01 | Disposition: A | Discharge status: Home / Self Care | Payer: No Typology Code available for payment source | Source: Ambulatory Visit | Attending: Internal Medicine | Admitting: Internal Medicine

## 2013-05-01 DIAGNOSIS — R1310 Dysphagia, unspecified: Secondary | ICD-10-CM | POA: Insufficient documentation

## 2013-05-01 MED ORDER — TECHNETIUM TC 99M SULFUR COLLOID
2.1000 | Freq: Once | INTRAVENOUS | Status: AC | PRN
  Administered 2013-05-01: 2.1 via INTRAVENOUS

## 2013-05-23 ENCOUNTER — Other Ambulatory Visit: Payer: Self-pay

## 2013-05-23 DIAGNOSIS — Z1231 Encounter for screening mammogram for malignant neoplasm of breast: Secondary | ICD-10-CM

## 2013-06-15 ENCOUNTER — Ambulatory Visit
Admission: RE | Admit: 2013-06-15 | Discharge: 2013-06-15 | Disposition: A | Discharge status: Home / Self Care | Payer: No Typology Code available for payment source | Source: Ambulatory Visit

## 2013-06-15 DIAGNOSIS — Z1231 Encounter for screening mammogram for malignant neoplasm of breast: Secondary | ICD-10-CM

## 2013-08-03 ENCOUNTER — Other Ambulatory Visit: Payer: Self-pay

## 2014-02-20 ENCOUNTER — Telehealth: Payer: Self-pay | Admitting: Internal Medicine

## 2014-02-20 DIAGNOSIS — Z1211 Encounter for screening for malignant neoplasm of colon: Secondary | ICD-10-CM

## 2014-02-20 NOTE — Telephone Encounter (Signed)
Dr Olevia Perches, patient wants to switch to omeprazole from Neixum. However, it appears that at your last office visit, she had GERD refractory to PPI's. At that time, we gave her Prilosec 40 bid. You then saw her for endoscopy and at that time asked her to switch to Nexium. Are you okay with her switching back to prilosec or does she need to continue Nexium?

## 2014-02-20 NOTE — Telephone Encounter (Signed)
OK to go back on Prilosec 40 mg, 1 po bid , it will be cheaper than Nexiem., May caLL IN a new prescription

## 2014-02-21 MED ORDER — OMEPRAZOLE 40 MG PO CPDR: 40.0000 mg | DELAYED_RELEASE_CAPSULE | Freq: Two times a day (BID) | ORAL | Status: DC

## 2014-02-21 NOTE — Telephone Encounter (Signed)
New rx sent

## 2014-05-24 ENCOUNTER — Other Ambulatory Visit: Payer: Self-pay | Admitting: Internal Medicine

## 2014-07-11 ENCOUNTER — Other Ambulatory Visit: Payer: Self-pay | Admitting: Internal Medicine

## 2014-07-30 ENCOUNTER — Other Ambulatory Visit: Payer: Self-pay

## 2014-07-30 DIAGNOSIS — Z1231 Encounter for screening mammogram for malignant neoplasm of breast: Secondary | ICD-10-CM

## 2014-08-14 ENCOUNTER — Ambulatory Visit
Admission: RE | Admit: 2014-08-14 | Discharge: 2014-08-14 | Disposition: A | Discharge status: Home / Self Care | Payer: BC Managed Care – PPO | Source: Ambulatory Visit

## 2014-08-14 DIAGNOSIS — Z1231 Encounter for screening mammogram for malignant neoplasm of breast: Secondary | ICD-10-CM

## 2014-08-17 ENCOUNTER — Other Ambulatory Visit: Payer: Self-pay | Admitting: Family Medicine

## 2014-08-17 DIAGNOSIS — R928 Other abnormal and inconclusive findings on diagnostic imaging of breast: Secondary | ICD-10-CM

## 2014-08-26 ENCOUNTER — Other Ambulatory Visit: Payer: Self-pay | Admitting: Internal Medicine

## 2014-09-04 ENCOUNTER — Ambulatory Visit
Admission: RE | Admit: 2014-09-04 | Discharge: 2014-09-04 | Disposition: A | Discharge status: Home / Self Care | Payer: BC Managed Care – PPO | Source: Ambulatory Visit | Attending: Family Medicine | Admitting: Family Medicine

## 2014-09-04 DIAGNOSIS — R928 Other abnormal and inconclusive findings on diagnostic imaging of breast: Secondary | ICD-10-CM

## 2015-02-07 ENCOUNTER — Other Ambulatory Visit: Payer: Self-pay | Admitting: Family Medicine

## 2015-02-07 DIAGNOSIS — N6001 Solitary cyst of right breast: Secondary | ICD-10-CM

## 2015-02-15 ENCOUNTER — Other Ambulatory Visit: Payer: Self-pay | Admitting: Internal Medicine

## 2015-02-19 ENCOUNTER — Other Ambulatory Visit (HOSPITAL_COMMUNITY): Payer: Self-pay | Admitting: Internal Medicine

## 2015-02-19 DIAGNOSIS — R7989 Other specified abnormal findings of blood chemistry: Secondary | ICD-10-CM

## 2015-02-19 DIAGNOSIS — E042 Nontoxic multinodular goiter: Secondary | ICD-10-CM

## 2015-03-07 ENCOUNTER — Other Ambulatory Visit: Payer: Self-pay | Admitting: Internal Medicine

## 2015-03-07 ENCOUNTER — Other Ambulatory Visit: Payer: Self-pay

## 2015-03-07 DIAGNOSIS — E042 Nontoxic multinodular goiter: Secondary | ICD-10-CM

## 2015-03-07 DIAGNOSIS — E049 Nontoxic goiter, unspecified: Secondary | ICD-10-CM

## 2015-03-08 ENCOUNTER — Ambulatory Visit
Admission: RE | Admit: 2015-03-08 | Discharge: 2015-03-08 | Disposition: A | Discharge status: Home / Self Care | Payer: 59 | Source: Ambulatory Visit | Attending: Family Medicine | Admitting: Family Medicine

## 2015-03-08 DIAGNOSIS — N6001 Solitary cyst of right breast: Secondary | ICD-10-CM

## 2015-03-11 ENCOUNTER — Ambulatory Visit (HOSPITAL_COMMUNITY): Payer: Self-pay

## 2015-03-12 ENCOUNTER — Inpatient Hospital Stay (HOSPITAL_COMMUNITY)
Admission: RE | Admit: 2015-03-12 | Discharge: 2015-03-12 | Disposition: A | Discharge status: Home / Self Care | Payer: Self-pay | Source: Ambulatory Visit | Attending: Internal Medicine | Admitting: Internal Medicine

## 2015-03-12 ENCOUNTER — Ambulatory Visit
Admission: RE | Admit: 2015-03-12 | Discharge: 2015-03-12 | Disposition: A | Discharge status: Home / Self Care | Payer: 59 | Source: Ambulatory Visit | Attending: Internal Medicine | Admitting: Internal Medicine

## 2015-03-12 DIAGNOSIS — E042 Nontoxic multinodular goiter: Secondary | ICD-10-CM

## 2015-03-19 ENCOUNTER — Other Ambulatory Visit: Payer: Self-pay | Admitting: Internal Medicine

## 2015-03-26 DIAGNOSIS — E210 Primary hyperparathyroidism: Secondary | ICD-10-CM | POA: Diagnosis not present

## 2015-03-26 DIAGNOSIS — E042 Nontoxic multinodular goiter: Secondary | ICD-10-CM | POA: Diagnosis not present

## 2015-03-26 DIAGNOSIS — R946 Abnormal results of thyroid function studies: Secondary | ICD-10-CM | POA: Diagnosis not present

## 2015-04-13 ENCOUNTER — Other Ambulatory Visit: Payer: Self-pay | Admitting: Internal Medicine

## 2015-05-17 ENCOUNTER — Encounter: Payer: Self-pay | Admitting: Internal Medicine

## 2015-06-19 DIAGNOSIS — K219 Gastro-esophageal reflux disease without esophagitis: Secondary | ICD-10-CM | POA: Diagnosis not present

## 2015-06-19 DIAGNOSIS — I10 Essential (primary) hypertension: Secondary | ICD-10-CM | POA: Diagnosis not present

## 2015-06-19 DIAGNOSIS — E782 Mixed hyperlipidemia: Secondary | ICD-10-CM | POA: Diagnosis not present

## 2015-06-19 DIAGNOSIS — M2550 Pain in unspecified joint: Secondary | ICD-10-CM | POA: Diagnosis not present

## 2015-06-19 DIAGNOSIS — G894 Chronic pain syndrome: Secondary | ICD-10-CM | POA: Diagnosis not present

## 2015-06-19 DIAGNOSIS — Z23 Encounter for immunization: Secondary | ICD-10-CM | POA: Diagnosis not present

## 2015-06-19 DIAGNOSIS — Z6829 Body mass index (BMI) 29.0-29.9, adult: Secondary | ICD-10-CM | POA: Diagnosis not present

## 2015-06-19 DIAGNOSIS — M159 Polyosteoarthritis, unspecified: Secondary | ICD-10-CM | POA: Diagnosis not present

## 2015-06-19 DIAGNOSIS — E669 Obesity, unspecified: Secondary | ICD-10-CM | POA: Diagnosis not present

## 2015-06-19 DIAGNOSIS — G608 Other hereditary and idiopathic neuropathies: Secondary | ICD-10-CM | POA: Diagnosis not present

## 2015-06-19 DIAGNOSIS — Z791 Long term (current) use of non-steroidal anti-inflammatories (NSAID): Secondary | ICD-10-CM | POA: Diagnosis not present

## 2015-07-03 ENCOUNTER — Other Ambulatory Visit: Payer: Self-pay

## 2015-07-03 DIAGNOSIS — Z1231 Encounter for screening mammogram for malignant neoplasm of breast: Secondary | ICD-10-CM

## 2015-07-12 DIAGNOSIS — N644 Mastodynia: Secondary | ICD-10-CM | POA: Diagnosis not present

## 2015-07-16 ENCOUNTER — Other Ambulatory Visit: Payer: Self-pay | Admitting: Family Medicine

## 2015-07-16 DIAGNOSIS — N644 Mastodynia: Secondary | ICD-10-CM

## 2015-08-16 ENCOUNTER — Ambulatory Visit
Admission: RE | Admit: 2015-08-16 | Discharge: 2015-08-16 | Disposition: A | Discharge status: Home / Self Care | Payer: Medicare Other | Source: Ambulatory Visit | Attending: Family Medicine | Admitting: Family Medicine

## 2015-08-16 DIAGNOSIS — N644 Mastodynia: Secondary | ICD-10-CM

## 2015-08-16 DIAGNOSIS — N6489 Other specified disorders of breast: Secondary | ICD-10-CM | POA: Diagnosis not present

## 2015-08-16 DIAGNOSIS — R928 Other abnormal and inconclusive findings on diagnostic imaging of breast: Secondary | ICD-10-CM | POA: Diagnosis not present

## 2015-12-03 DIAGNOSIS — H52203 Unspecified astigmatism, bilateral: Secondary | ICD-10-CM | POA: Diagnosis not present

## 2015-12-03 DIAGNOSIS — H40012 Open angle with borderline findings, low risk, left eye: Secondary | ICD-10-CM | POA: Diagnosis not present

## 2015-12-03 DIAGNOSIS — H40011 Open angle with borderline findings, low risk, right eye: Secondary | ICD-10-CM | POA: Diagnosis not present

## 2015-12-03 DIAGNOSIS — H2513 Age-related nuclear cataract, bilateral: Secondary | ICD-10-CM | POA: Diagnosis not present

## 2015-12-30 DIAGNOSIS — Z0000 Encounter for general adult medical examination without abnormal findings: Secondary | ICD-10-CM | POA: Diagnosis not present

## 2015-12-30 DIAGNOSIS — M069 Rheumatoid arthritis, unspecified: Secondary | ICD-10-CM | POA: Diagnosis not present

## 2015-12-30 DIAGNOSIS — E782 Mixed hyperlipidemia: Secondary | ICD-10-CM | POA: Diagnosis not present

## 2015-12-30 DIAGNOSIS — F419 Anxiety disorder, unspecified: Secondary | ICD-10-CM | POA: Diagnosis not present

## 2015-12-30 DIAGNOSIS — E042 Nontoxic multinodular goiter: Secondary | ICD-10-CM | POA: Diagnosis not present

## 2015-12-30 DIAGNOSIS — I10 Essential (primary) hypertension: Secondary | ICD-10-CM | POA: Diagnosis not present

## 2015-12-30 DIAGNOSIS — K219 Gastro-esophageal reflux disease without esophagitis: Secondary | ICD-10-CM | POA: Diagnosis not present

## 2015-12-30 DIAGNOSIS — G894 Chronic pain syndrome: Secondary | ICD-10-CM | POA: Diagnosis not present

## 2016-02-26 DIAGNOSIS — E042 Nontoxic multinodular goiter: Secondary | ICD-10-CM | POA: Diagnosis not present

## 2016-02-26 DIAGNOSIS — E210 Primary hyperparathyroidism: Secondary | ICD-10-CM | POA: Diagnosis not present

## 2016-04-29 ENCOUNTER — Other Ambulatory Visit: Payer: Self-pay | Admitting: Obstetrics and Gynecology

## 2016-04-29 DIAGNOSIS — N766 Ulceration of vulva: Secondary | ICD-10-CM | POA: Diagnosis not present

## 2016-04-29 DIAGNOSIS — Z113 Encounter for screening for infections with a predominantly sexual mode of transmission: Secondary | ICD-10-CM | POA: Diagnosis not present

## 2016-06-15 DIAGNOSIS — A599 Trichomoniasis, unspecified: Secondary | ICD-10-CM | POA: Diagnosis not present

## 2016-06-19 DIAGNOSIS — H40052 Ocular hypertension, left eye: Secondary | ICD-10-CM | POA: Diagnosis not present

## 2016-06-19 DIAGNOSIS — H40051 Ocular hypertension, right eye: Secondary | ICD-10-CM | POA: Diagnosis not present

## 2016-06-19 DIAGNOSIS — H40011 Open angle with borderline findings, low risk, right eye: Secondary | ICD-10-CM | POA: Diagnosis not present

## 2016-06-19 DIAGNOSIS — H40012 Open angle with borderline findings, low risk, left eye: Secondary | ICD-10-CM | POA: Diagnosis not present

## 2016-06-30 DIAGNOSIS — M069 Rheumatoid arthritis, unspecified: Secondary | ICD-10-CM | POA: Diagnosis not present

## 2016-06-30 DIAGNOSIS — E782 Mixed hyperlipidemia: Secondary | ICD-10-CM | POA: Diagnosis not present

## 2016-06-30 DIAGNOSIS — K219 Gastro-esophageal reflux disease without esophagitis: Secondary | ICD-10-CM | POA: Diagnosis not present

## 2016-06-30 DIAGNOSIS — G629 Polyneuropathy, unspecified: Secondary | ICD-10-CM | POA: Diagnosis not present

## 2016-06-30 DIAGNOSIS — I10 Essential (primary) hypertension: Secondary | ICD-10-CM | POA: Diagnosis not present

## 2016-06-30 DIAGNOSIS — M1990 Unspecified osteoarthritis, unspecified site: Secondary | ICD-10-CM | POA: Diagnosis not present

## 2016-06-30 DIAGNOSIS — F419 Anxiety disorder, unspecified: Secondary | ICD-10-CM | POA: Diagnosis not present

## 2016-06-30 DIAGNOSIS — Z23 Encounter for immunization: Secondary | ICD-10-CM | POA: Diagnosis not present

## 2016-07-27 DIAGNOSIS — E790 Hyperuricemia without signs of inflammatory arthritis and tophaceous disease: Secondary | ICD-10-CM | POA: Diagnosis not present

## 2016-07-27 DIAGNOSIS — M159 Polyosteoarthritis, unspecified: Secondary | ICD-10-CM | POA: Diagnosis not present

## 2016-07-27 DIAGNOSIS — M2550 Pain in unspecified joint: Secondary | ICD-10-CM | POA: Diagnosis not present

## 2016-07-27 DIAGNOSIS — G608 Other hereditary and idiopathic neuropathies: Secondary | ICD-10-CM | POA: Diagnosis not present

## 2016-09-03 ENCOUNTER — Other Ambulatory Visit: Payer: Self-pay | Admitting: Family Medicine

## 2016-09-03 DIAGNOSIS — Z1231 Encounter for screening mammogram for malignant neoplasm of breast: Secondary | ICD-10-CM

## 2016-10-04 IMAGING — US US SOFT TISSUE HEAD/NECK
1 series · 14 of 25 positions shown · non-contrast
Comparison: Ultrasound March 25, 2011.

CLINICAL DATA: Multinodular goiter.

EXAM:
THYROID ULTRASOUND
TECHNIQUE: Ultrasound examination of the thyroid gland and adjacent soft
tissues was performed.

[Series 1: us soft tissue head/neck · 0.10mm/px · 14 of 67 slices shown]
[im 1/67]
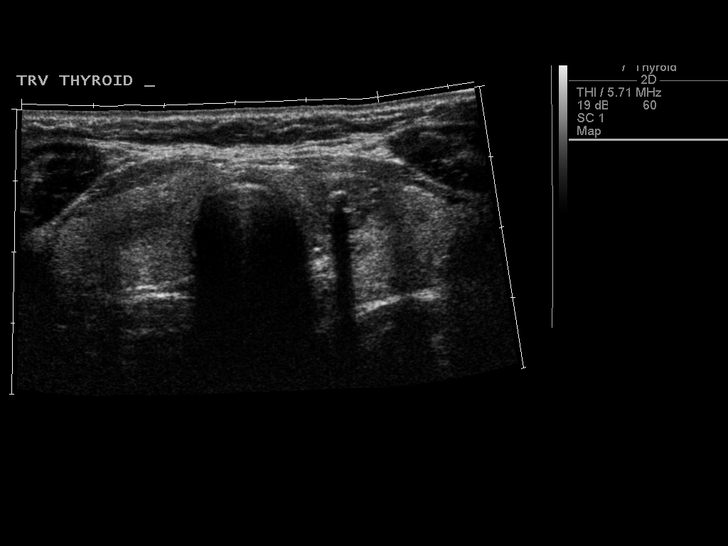
[im 6/67]
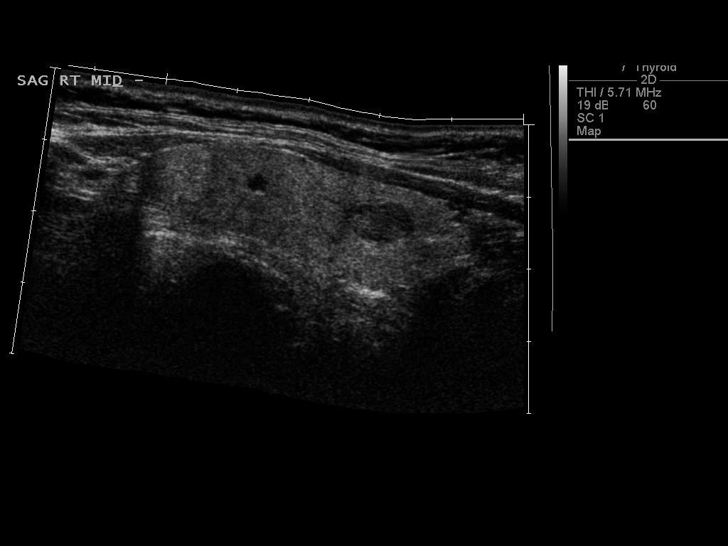
[im 12/67]
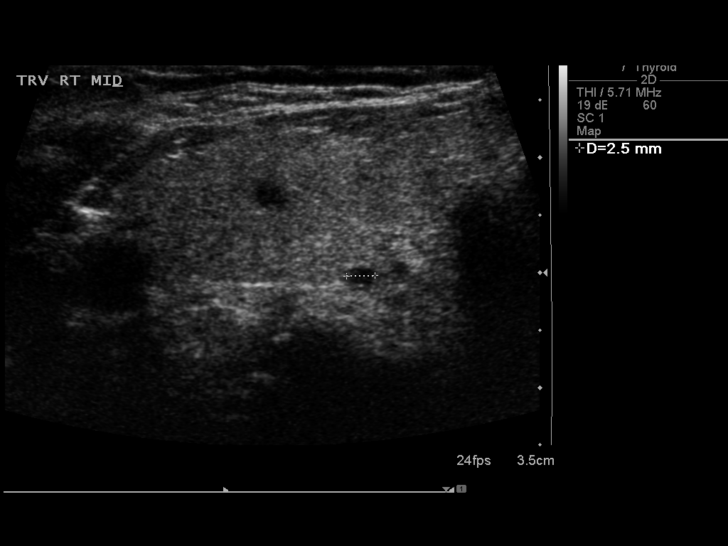
[im 17/67]
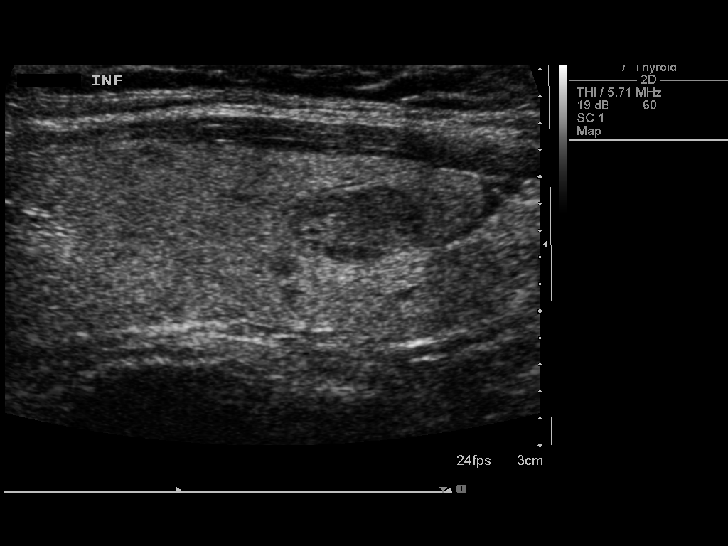
[im 23/67]
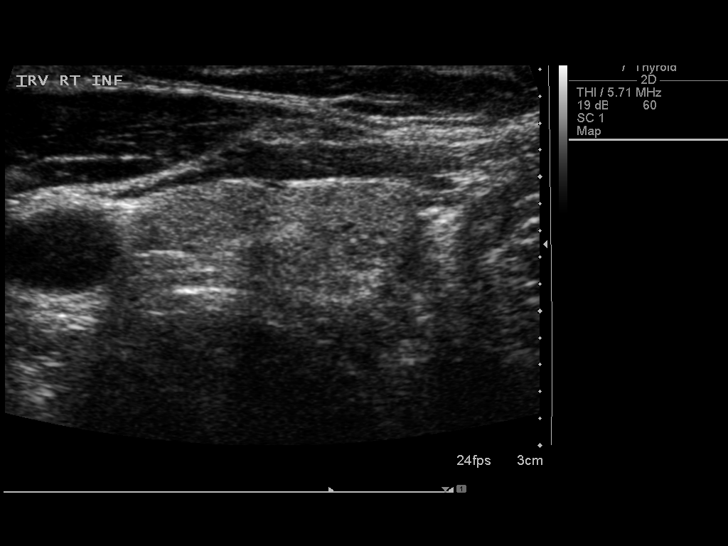
[im 25/67]
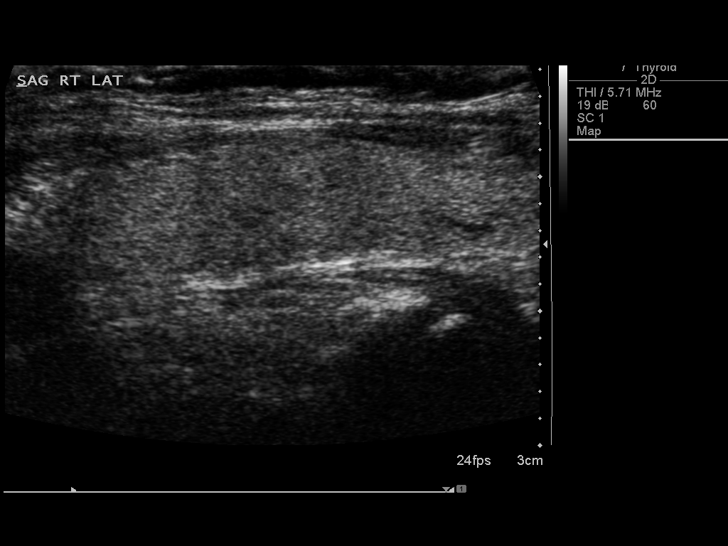
[im 31/67]
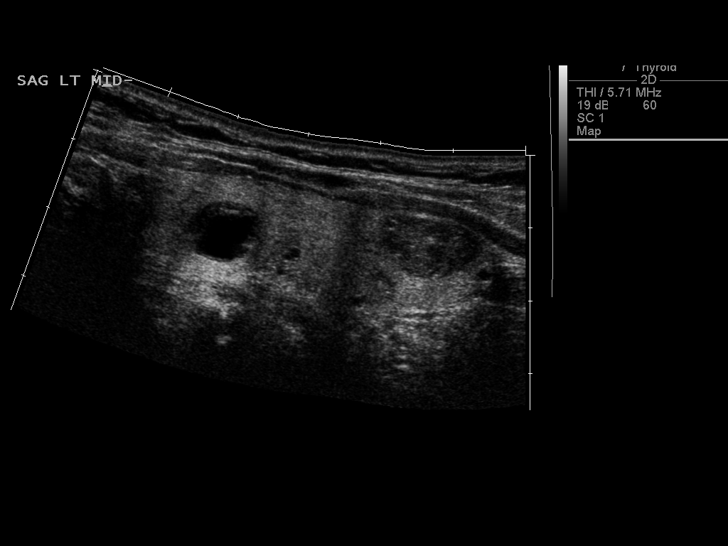
[im 36/67]
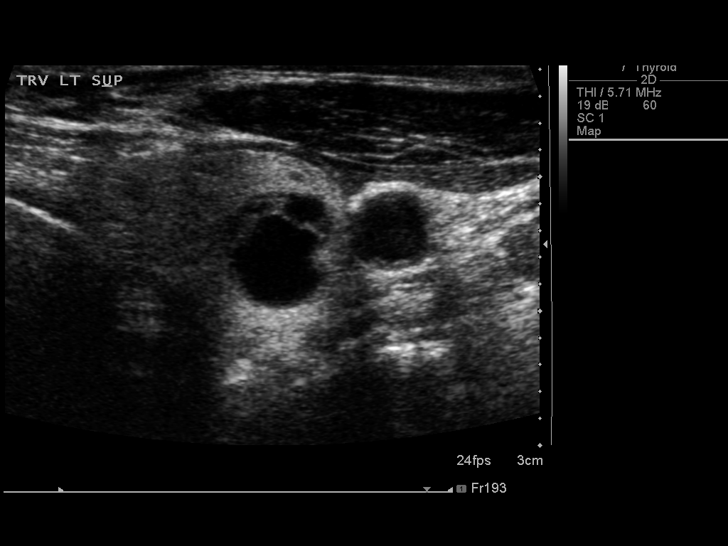
[im 42/67]
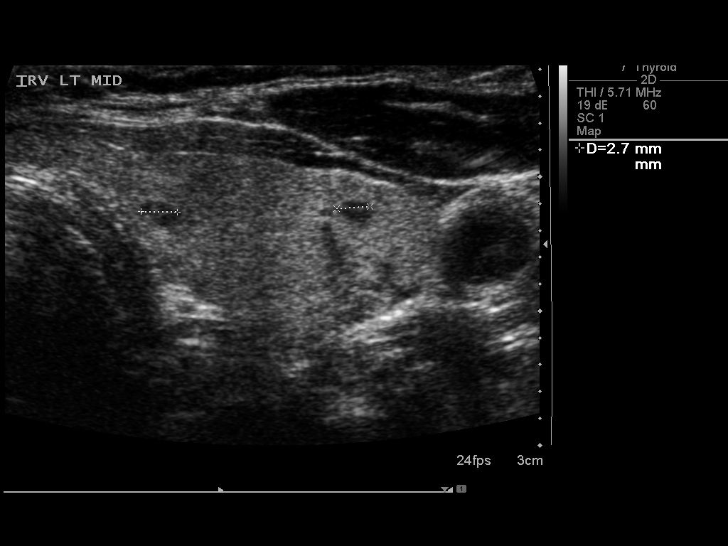
[im 45/67]
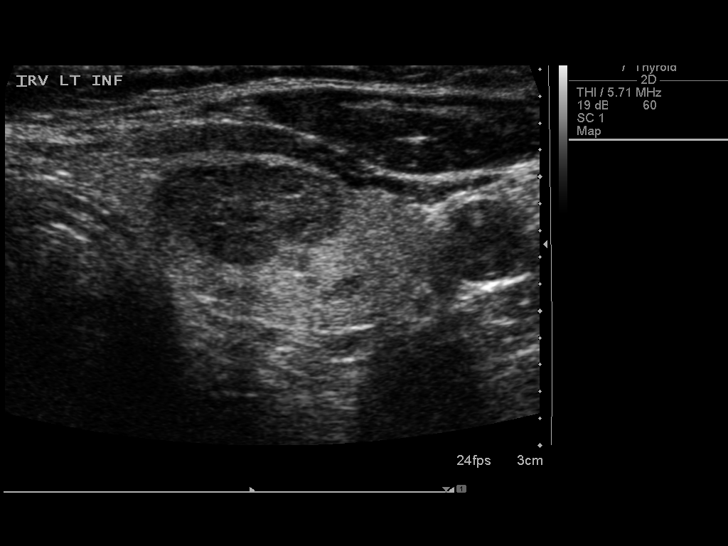
[im 50/67]
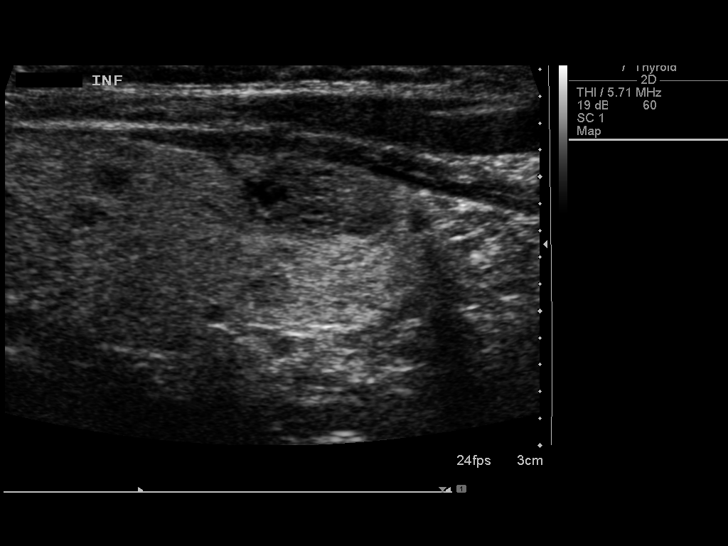
[im 56/67]
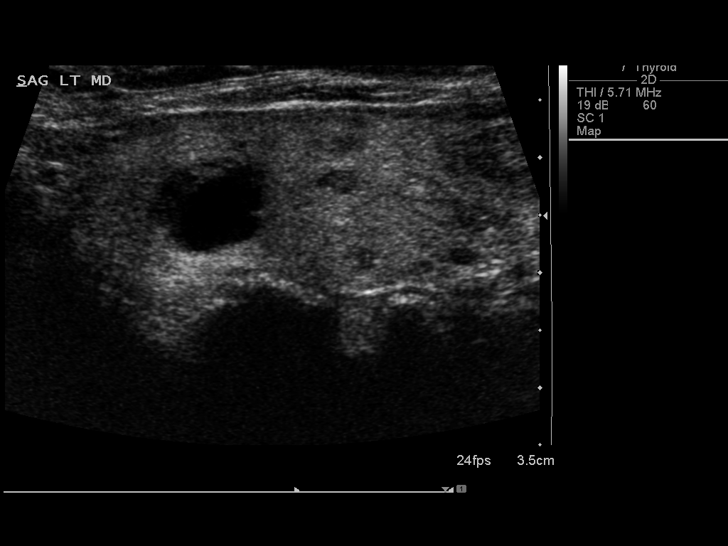
[im 61/67]
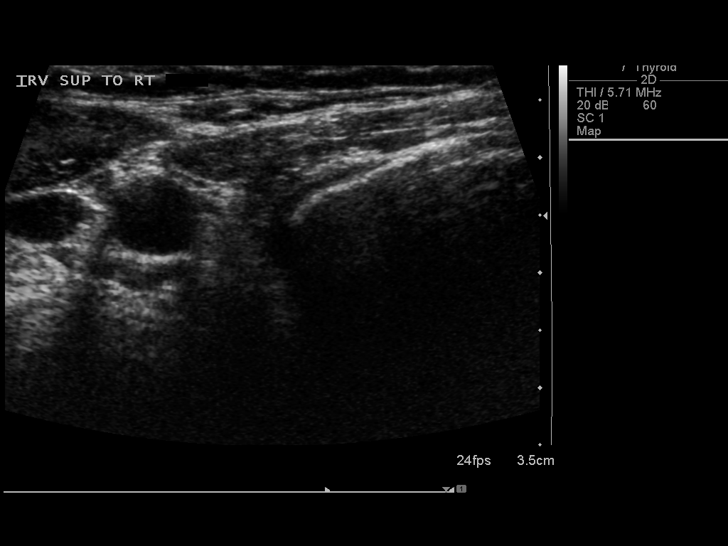
[im 67/67]
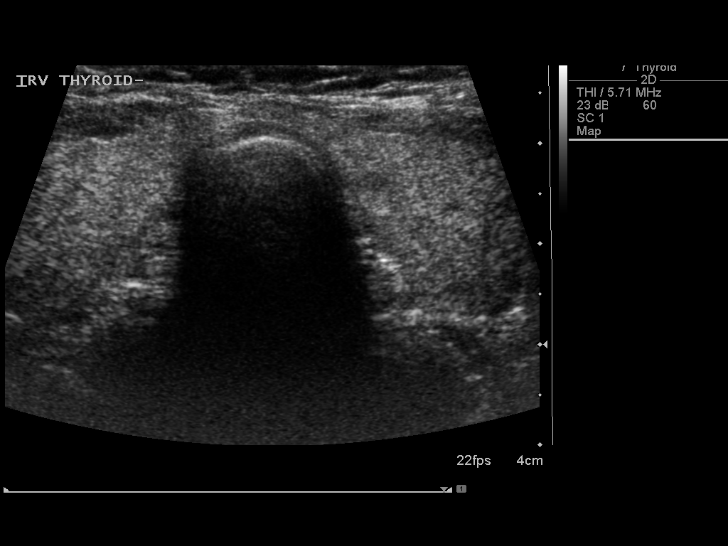

[14 of 25 positions shown; findings below may reference images not displayed]

FINDINGS: Right thyroid lobe

Measurements: 4.9 x 2.4 x 1.4 cm. Multiple nodules are noted with
the largest being solid nodule measuring 1.2 x 1.1 x 0.6 cm in
inferior pole. This is unchanged compared to prior exam.

Left thyroid lobe

Measurements: 5.3 x 2.2 x 1.5 cm. Multiple nodules are noted, with
the largest being solid nodule measuring 1.6 x 1.5 x 0.8 cm in
inferior pole with associated calcifications. This is unchanged
compared to prior exam. Reportedly, this has been previously
biopsied. Stable predominantly cystic nodule measuring 1.0 x 0.9 x
0.8 cm is noted in upper pole.

Isthmus

Thickness: 2.2 mm.  No nodules visualized.

Lymphadenopathy

None visualized.
IMPRESSION: Stable bilateral thyroid nodules are noted compared to prior exam.

## 2016-10-07 ENCOUNTER — Ambulatory Visit: Payer: Medicare Other

## 2016-11-05 ENCOUNTER — Ambulatory Visit
Admission: RE | Admit: 2016-11-05 | Discharge: 2016-11-05 | Disposition: A | Discharge status: Home / Self Care | Payer: Medicare Other | Source: Ambulatory Visit | Attending: Family Medicine | Admitting: Family Medicine

## 2016-11-05 DIAGNOSIS — Z1231 Encounter for screening mammogram for malignant neoplasm of breast: Secondary | ICD-10-CM

## 2016-12-22 DIAGNOSIS — H401131 Primary open-angle glaucoma, bilateral, mild stage: Secondary | ICD-10-CM | POA: Diagnosis not present

## 2016-12-30 DIAGNOSIS — M8580 Other specified disorders of bone density and structure, unspecified site: Secondary | ICD-10-CM | POA: Diagnosis not present

## 2016-12-30 DIAGNOSIS — Z1159 Encounter for screening for other viral diseases: Secondary | ICD-10-CM | POA: Diagnosis not present

## 2016-12-30 DIAGNOSIS — Z0000 Encounter for general adult medical examination without abnormal findings: Secondary | ICD-10-CM | POA: Diagnosis not present

## 2016-12-30 DIAGNOSIS — G629 Polyneuropathy, unspecified: Secondary | ICD-10-CM | POA: Diagnosis not present

## 2016-12-30 DIAGNOSIS — E210 Primary hyperparathyroidism: Secondary | ICD-10-CM | POA: Diagnosis not present

## 2016-12-30 DIAGNOSIS — D485 Neoplasm of uncertain behavior of skin: Secondary | ICD-10-CM | POA: Diagnosis not present

## 2016-12-30 DIAGNOSIS — E782 Mixed hyperlipidemia: Secondary | ICD-10-CM | POA: Diagnosis not present

## 2016-12-30 DIAGNOSIS — E042 Nontoxic multinodular goiter: Secondary | ICD-10-CM | POA: Diagnosis not present

## 2016-12-30 DIAGNOSIS — E669 Obesity, unspecified: Secondary | ICD-10-CM | POA: Diagnosis not present

## 2016-12-30 DIAGNOSIS — I10 Essential (primary) hypertension: Secondary | ICD-10-CM | POA: Diagnosis not present

## 2016-12-30 DIAGNOSIS — K219 Gastro-esophageal reflux disease without esophagitis: Secondary | ICD-10-CM | POA: Diagnosis not present

## 2016-12-30 DIAGNOSIS — M069 Rheumatoid arthritis, unspecified: Secondary | ICD-10-CM | POA: Diagnosis not present

## 2017-01-21 DIAGNOSIS — D225 Melanocytic nevi of trunk: Secondary | ICD-10-CM | POA: Diagnosis not present

## 2017-01-21 DIAGNOSIS — L814 Other melanin hyperpigmentation: Secondary | ICD-10-CM | POA: Diagnosis not present

## 2017-01-21 DIAGNOSIS — L820 Inflamed seborrheic keratosis: Secondary | ICD-10-CM | POA: Diagnosis not present

## 2017-01-21 DIAGNOSIS — L821 Other seborrheic keratosis: Secondary | ICD-10-CM | POA: Diagnosis not present

## 2017-02-16 DIAGNOSIS — G608 Other hereditary and idiopathic neuropathies: Secondary | ICD-10-CM | POA: Diagnosis not present

## 2017-02-16 DIAGNOSIS — Z6830 Body mass index (BMI) 30.0-30.9, adult: Secondary | ICD-10-CM | POA: Diagnosis not present

## 2017-02-16 DIAGNOSIS — M2550 Pain in unspecified joint: Secondary | ICD-10-CM | POA: Diagnosis not present

## 2017-02-16 DIAGNOSIS — Z79899 Other long term (current) drug therapy: Secondary | ICD-10-CM | POA: Diagnosis not present

## 2017-02-16 DIAGNOSIS — E669 Obesity, unspecified: Secondary | ICD-10-CM | POA: Diagnosis not present

## 2017-02-16 DIAGNOSIS — E790 Hyperuricemia without signs of inflammatory arthritis and tophaceous disease: Secondary | ICD-10-CM | POA: Diagnosis not present

## 2017-02-16 DIAGNOSIS — M159 Polyosteoarthritis, unspecified: Secondary | ICD-10-CM | POA: Diagnosis not present

## 2017-03-09 ENCOUNTER — Ambulatory Visit: Payer: Medicare Other | Admitting: Gastroenterology

## 2017-04-02 ENCOUNTER — Ambulatory Visit (INDEPENDENT_AMBULATORY_CARE_PROVIDER_SITE_OTHER): Payer: Medicare Other | Admitting: Gastroenterology

## 2017-04-02 ENCOUNTER — Encounter: Payer: Self-pay | Admitting: Gastroenterology

## 2017-04-02 VITALS — BP 118/80 | HR 82 | Ht 61.0 in | Wt 161.0 lb

## 2017-04-02 DIAGNOSIS — R1310 Dysphagia, unspecified: Secondary | ICD-10-CM

## 2017-04-02 DIAGNOSIS — K5900 Constipation, unspecified: Secondary | ICD-10-CM

## 2017-04-02 DIAGNOSIS — K219 Gastro-esophageal reflux disease without esophagitis: Secondary | ICD-10-CM | POA: Diagnosis not present

## 2017-04-02 MED ORDER — PANTOPRAZOLE SODIUM 40 MG PO TBEC: 40.0000 mg | DELAYED_RELEASE_TABLET | Freq: Every day | ORAL | 3 refills | Status: DC

## 2017-04-02 NOTE — Progress Notes (Addendum)
[name]    [id number]    22-Apr-1951  Primary Care Physician:Miller, Lattie Haw, MD  Referring Physician: Kathyrn Lass, MD [place], [place] [address]  Chief complaint:  Dysphagia, Constipation  HPI: 66 year old female with history of severe GERD, previously followed by Dr. Olevia Perches was last seen in office 03/17/2013 is here to reestablish care. Patient was taking omeprazole in the past with somewhat stable symptoms but she stopped taking PPI about a year and half to 2 years ago because of concern of potential side effects. She is currently taking ranitidine as needed with severe heartburn almost daily. Patient is also having difficulty swallowing meat or dense bread and she has to regurgitate. No history of food impaction. She was having frequent episodes in the past few months, has changed her diet in the last 1 month as she is afraid to have choking and doesn't feel like eating. She also complains of change in bowel habits with worsening constipation, is having one to 2 bowel movements a week. She started taking the prebiotic and a probiotic with some improvement Denies any diarrhea, blood per rectum, melena, nausea or vomiting.  She had colonoscopy July 2014 was a normal exam. EGD July 2014 showed mild non-obstructive stricture in distal esophagus, 4 cm hiatal hernia, was dilated with Maloney dilator to 81 [place] with some resistance.    Outpatient Encounter Prescriptions as of 04/02/2017  Medication Sig  . atenolol (TENORMIN) 100 MG tablet Take 100 mg by mouth daily.    Marland Kitchen atorvastatin (LIPITOR) 40 MG tablet Take 1 tablet by mouth daily.  . calcium carbonate (TUMS EX) 750 MG chewable tablet Chew 1 tablet by mouth as needed for heartburn.  Marland Kitchen CALCIUM-VITAMIN D PO Take 1 capsule by mouth daily as needed. 1000 IU daily  . cyclobenzaprine (FLEXERIL) 10 MG tablet Take 10 mg by mouth 3 (three) times daily as needed.    . DULoxetine (CYMBALTA) 60 MG capsule Take 1  capsule by mouth daily.  . hydrochlorothiazide (HYDRODIURIL) 25 MG tablet Take 1 tablet by mouth daily.  Marland Kitchen latanoprost (XALATAN) 0.005 % ophthalmic solution Apply 1 drop to eye at bedtime.    Marland Kitchen LORazepam (ATIVAN) 0.5 MG tablet Take 1/2-1 tablet by mouth every 4-6 hours as needed for anxiety   . meloxicam (MOBIC) 15 MG tablet Take 1 tablet by mouth daily.  . ranitidine (ZANTAC) 150 MG tablet Take 150 mg by mouth as needed for heartburn.  . [DISCONTINUED] diclofenac sodium (VOLTAREN) 1 % GEL Apply topically. Use four times daily as directed   . [DISCONTINUED] metoCLOPramide (REGLAN) 5 MG tablet Take 1 tablet (5 mg total) by mouth at bedtime.  . [DISCONTINUED] metoCLOPramide (REGLAN) 5 MG tablet Take 1 tablet (5 mg total) by mouth at bedtime.  . [DISCONTINUED] omeprazole (PRILOSEC) 40 MG capsule TAKE 1 CAPSULE (40 MG TOTAL) BY MOUTH 2 (TWO) TIMES DAILY.  . [DISCONTINUED] rosuvastatin (CRESTOR) 40 MG tablet Take 40 mg by mouth daily.    . [DISCONTINUED] sucralfate (CARAFATE) 1 GM/10ML suspension Take 10 mLs (1 g total) by mouth 2 (two) times daily.  . [DISCONTINUED] valsartan (DIOVAN) 160 MG tablet Take 160 mg by mouth daily.     No facility-administered encounter medications on file as of 04/02/2017.     Allergies as of 04/02/2017 - Review Complete 04/02/2017  Allergen Reaction Noted  . Lisinopril Cough 01/09/2011    Past Medical History:  Diagnosis Date  . Anomalous left  coronary artery   . Anxiety   . Fibromyalgia   . Hepatic cyst 11/18/10  . Hiatal hernia 11/18/10  . Hyperlipidemia   . Hyperplastic colon polyp   . Hypertension   . MVP (mitral valve prolapse)   . Obesity   . Osteoarthritis   . RA (rheumatoid arthritis) ([place])   . Thyroid nodule    left    Past Surgical History:  Procedure Laterality Date  . axillary breast tissue excision  11/1998  . CESAREAN SECTION    . FOOT SURGERY Left   . NASAL SEPTUM SURGERY    . TUBAL LIGATION    . VAGINAL HYSTERECTOMY     partial     Family History  Problem Relation Age of Onset  . Breast cancer Maternal Aunt   . Heart disease Father   . Emphysema Father        hx of smoking  . Diabetes Maternal Aunt   . Stroke Mother   . COPD Mother        former smoker  . Colon cancer Neg Hx     Social History   Social History  . Marital status: Divorced    Spouse name: N/A  . Number of children: 1  . Years of education: N/A   Occupational History  . 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 Smoking status: Never Smoker  . Smokeless tobacco: Never Used  . Alcohol use Yes     Comment: weekends 1 bottle wine  . Drug use: No  . Sexual activity: Not on file   Other Topics Concern  . Not on file   Social History Narrative  . No narrative on file      Review of systems: Review of Systems  Constitutional: Negative for fever and chills.  HENT: Negative.   Eyes: Negative for blurred vision.  Respiratory: Negative for cough, shortness of breath and wheezing.   Cardiovascular: Negative for chest pain and palpitations.  Gastrointestinal: as per HPI Genitourinary: Negative for dysuria, urgency, frequency and hematuria.  Musculoskeletal: Negative for myalgias, back pain and joint pain.  Skin: Negative for itching and rash.  Neurological: Negative for dizziness, tremors, focal weakness, seizures and loss of consciousness.  Endo/Heme/Allergies: Positive for seasonal allergies.  Psychiatric/Behavioral: Negative for depression, suicidal ideas and hallucinations.  All other systems reviewed and are negative.   Physical Exam: Vitals:   04/02/17 1459  BP: 118/80  Pulse: 82   Body mass index is 30.42 kg/m. Gen:      No acute distress HEENT:  EOMI, sclera anicteric Neck:     No masses; no thyromegaly Lungs:    Clear to auscultation bilaterally; normal respiratory effort CV:         Regular rate and rhythm; no murmurs Abd:      + bowel sounds; soft, non-tender; no palpable masses, no  distension Ext:    No edema; adequate peripheral perfusion Skin:      Warm and dry; no rash Neuro: alert and oriented x 3 Psych: normal mood and affect  Data Reviewed:  Reviewed labs, radiology imaging, old records and pertinent past GI work up   Assessment and Plan/Recommendations:  66 year old female with history of chronic GERD here with worsening reflux symptoms, solid dysphagia and worsening constipation  GERD: Start Protonix 40 mg daily, 30 minutes before breakfast Continue H2 blocker at bedtime as needed Discussed antireflux measures in light of some modification in detail including diet and exercise  Solid dysphagia:  Concerning for recurrent peptic stricture Schedule for EGD for evaluation and possible dilation The risks and benefits as well as alternatives of endoscopic procedure(s) have been discussed and reviewed. All questions answered. The patient agrees to proceed.  Constipation: Increase dietary fluid and fiber intake MiraLAX half capful daily as needed If continues to have persistent symptoms we'll consider either Linzess or Amitiza  Colorectal cancer screening: Average risk Due for screening July 2024  K. Denzil Magnuson , MD [phone] Mon-Fri 8a-5p [phone] after 5p, weekends, holidays  CC: Kathyrn Lass, MD

## 2017-04-02 NOTE — Patient Instructions (Addendum)
You have been scheduled for an endoscopy. Please follow written instructions given to you at your visit today. If you use inhalers (even only as needed), please bring them with you on the day of your procedure. Your physician has requested that you go to www.startemmi.com and enter the access code given to you at your visit today. This web site gives a general overview about your procedure. However, you should still follow specific instructions given to you by our office regarding your preparation for the procedure.   We have sent Protonix to your pharmacy  Continue ranitidine 150mg    Follow up in 3 months

## 2017-04-05 ENCOUNTER — Encounter: Payer: Self-pay | Admitting: Gastroenterology

## 2017-04-05 ENCOUNTER — Ambulatory Visit (AMBULATORY_SURGERY_CENTER): Payer: Medicare Other | Admitting: Gastroenterology

## 2017-04-05 VITALS — BP 111/67 | HR 58 | Temp 98.8°F | Resp 11 | Ht 61.0 in | Wt 161.0 lb

## 2017-04-05 DIAGNOSIS — K222 Esophageal obstruction: Secondary | ICD-10-CM

## 2017-04-05 DIAGNOSIS — R1310 Dysphagia, unspecified: Secondary | ICD-10-CM

## 2017-04-05 DIAGNOSIS — K219 Gastro-esophageal reflux disease without esophagitis: Secondary | ICD-10-CM | POA: Diagnosis not present

## 2017-04-05 DIAGNOSIS — M797 Fibromyalgia: Secondary | ICD-10-CM | POA: Diagnosis not present

## 2017-04-05 DIAGNOSIS — I10 Essential (primary) hypertension: Secondary | ICD-10-CM | POA: Diagnosis not present

## 2017-04-05 DIAGNOSIS — R1319 Other dysphagia: Secondary | ICD-10-CM

## 2017-04-05 MED ORDER — SODIUM CHLORIDE 0.9 % IV SOLN: 500.0000 mL | INTRAVENOUS | Status: DC

## 2017-04-05 NOTE — Op Note (Signed)
[facility] Patient Name: [name] Procedure Date: 04/05/2017 3:14 PM MRN: [id number] Endoscopist: Mauri Pole , MD Age: 66 Referring MD:  Date of Birth: Oct 20, 1950 Gender: Female Account #: [id number] Procedure:                Upper GI endoscopy Indications:              Dysphagia Medicines:                Monitored Anesthesia Care Procedure:                Pre-Anesthesia Assessment:                           - Prior to the procedure, a History and Physical                            was performed, and patient medications and                            allergies were reviewed. The patient's tolerance of                            previous anesthesia was also reviewed. The risks                            and benefits of the procedure and the sedation                            options and risks were discussed with the patient.                            All questions were answered, and informed consent                            was obtained. Prior Anticoagulants: The patient has                            taken no previous anticoagulant or antiplatelet                            agents. ASA Grade Assessment: II - A patient with                            mild systemic disease. After reviewing the risks                            and benefits, the patient was deemed in                            satisfactory condition to undergo the procedure.                           After obtaining informed consent, the endoscope was  passed under direct vision. Throughout the                            procedure, the patient's blood pressure, pulse, and                            oxygen saturations were monitored continuously. The                            Endoscope was introduced through the mouth, and                            advanced to the second part of duodenum. The upper                            GI endoscopy was accomplished without  difficulty.                            The patient tolerated the procedure well. Scope In: Scope Out: Findings:                 A small hiatal hernia was present.                           One mild (non-circumferential scarring)                            benign-appearing, intrinsic stenosis was found 32                            to 33 cm from the incisors. This measured 1.5 cm                            (inner diameter) x less than one cm (in length) and                            was traversed. A TTS dilator was passed through the                            scope. Dilation with a 16-17-18 mm balloon and an                            18-19-20 mm balloon dilator was performed to 20 mm.                            The dilation site was examined following endoscope                            reinsertion and showed mild improvement in luminal                            narrowing.  The stomach was normal.                           The examined duodenum was normal. Complications:            No immediate complications. Estimated Blood Loss:     Estimated blood loss: none. Impression:               - Small hiatal hernia.                           - Benign-appearing esophageal stenosis. Dilated.                           - Normal stomach.                           - Normal examined duodenum.                           - No specimens collected. Recommendation:           - Patient has a contact number available for                            emergencies. The signs and symptoms of potential                            delayed complications were discussed with the                            patient. Return to normal activities tomorrow.                            Written discharge instructions were provided to the                            patient.                           - Resume previous diet.                           - Continue present medications.                            - Follow an antireflux regimen. Mauri Pole, MD 04/05/2017 3:51:00 PM This report has been signed electronically.

## 2017-04-05 NOTE — Patient Instructions (Addendum)
GERD,Esophageal Stricture, and Esophageal Dilation diet handouts given.  Resume your current medications. Call us with any questions or concerns. Thank you!  YOU HAD AN ENDOSCOPIC PROCEDURE TODAY AT [facility]:   Refer to the procedure report that was given to you for any specific questions about what was found during the examination.  If the procedure report does not answer your questions, please call your gastroenterologist to clarify.  If you requested that your care partner not be given the details of your procedure findings, then the procedure report has been included in a sealed envelope for you to review at your convenience later.  YOU SHOULD EXPECT: Some feelings of bloating in the abdomen. Passage of more gas than usual.  Walking can help get rid of the air that was put into your GI tract during the procedure and reduce the bloating. If you had a lower endoscopy (such as a colonoscopy or flexible sigmoidoscopy) you may notice spotting of blood in your stool or on the toilet paper. If you underwent a bowel prep for your procedure, you may not have a normal bowel movement for a few days.  Please Note:  You might notice some irritation and congestion in your nose or some drainage.  This is from the oxygen used during your procedure.  There is no need for concern and it should clear up in a day or so.  SYMPTOMS TO REPORT IMMEDIATELY:   Following upper endoscopy (EGD)  Vomiting of blood or coffee ground material  New chest pain or pain under the shoulder blades  Painful or persistently difficult swallowing  New shortness of breath  Fever of 100F or higher  Black, tarry-looking stools  For urgent or emergent issues, a gastroenterologist can be reached at any hour by calling [phone].   DIET:  Nothing by mouth for 1 hour, then clear liquids for 1 hour, then soft diet rest of today. Resume your regular diet tomorrow. (see handout given on Dilation Diet). Drink plenty  of fluids but you should avoid alcoholic beverages for 24 hours.  ACTIVITY:  You should plan to take it easy for the rest of today and you should NOT DRIVE or use heavy machinery until tomorrow (because of the sedation medicines used during the test).    FOLLOW UP: Our staff will call the number listed on your records the next business day following your procedure to check on you and address any questions or concerns that you may have regarding the information given to you following your procedure. If we do not reach you, we will leave a message.  However, if you are feeling well and you are not experiencing any problems, there is no need to return our call.  We will assume that you have returned to your regular daily activities without incident.  If any biopsies were taken you will be contacted by phone or by letter within the next 1-3 weeks.  Please call us at [phone] if you have not heard about the biopsies in 3 weeks.    SIGNATURES/CONFIDENTIALITY: You and/or your care partner have signed paperwork which will be entered into your electronic medical record.  These signatures attest to the fact that that the information above on your After Visit Summary has been reviewed and is understood.  Full responsibility of the confidentiality of this discharge information lies with you and/or your care-partner.

## 2017-04-05 NOTE — Progress Notes (Signed)
Pt. Reports no change in her surgical or medical history since pre-visit 04/02/2017.

## 2017-04-05 NOTE — Progress Notes (Signed)
To recovery, report to Myers, RN, VSS. 

## 2017-04-05 NOTE — Progress Notes (Signed)
Called to room to assist during endoscopic procedure.  Patient ID and intended procedure confirmed with present staff. Received instructions for my participation in the procedure from the performing physician.  

## 2017-04-06 ENCOUNTER — Telehealth: Payer: Self-pay

## 2017-04-06 NOTE — Telephone Encounter (Signed)
  Follow up Call-  Call back number 04/05/2017  Post procedure Call Back phone  # [phone]  Permission to leave phone message Yes  Some recent data might be hidden     Patient questions:  Do you have a fever, pain , or abdominal swelling? No. Pain Score  0 *  Have you tolerated food without any problems? Yes.    Have you been able to return to your normal activities? Yes.    Do you have any questions about your discharge instructions: Diet   No. Medications  No. Follow up visit  No.  Do you have questions or concerns about your Care? No.  Actions: * If pain score is 4 or above: No action needed, pain <4.

## 2017-07-07 DIAGNOSIS — H2513 Age-related nuclear cataract, bilateral: Secondary | ICD-10-CM | POA: Diagnosis not present

## 2017-07-07 DIAGNOSIS — H401131 Primary open-angle glaucoma, bilateral, mild stage: Secondary | ICD-10-CM | POA: Diagnosis not present

## 2017-07-07 DIAGNOSIS — H52203 Unspecified astigmatism, bilateral: Secondary | ICD-10-CM | POA: Diagnosis not present

## 2017-07-14 ENCOUNTER — Telehealth: Payer: Self-pay | Admitting: *Deleted

## 2017-07-14 MED ORDER — PANTOPRAZOLE SODIUM 40 MG PO TBEC: 40.0000 mg | DELAYED_RELEASE_TABLET | Freq: Every day | ORAL | 4 refills | Status: DC

## 2017-07-14 NOTE — Telephone Encounter (Signed)
Refilled Protonix per [facility] fax request

## 2017-07-18 ENCOUNTER — Other Ambulatory Visit: Payer: Self-pay | Admitting: Gastroenterology

## 2017-07-29 DIAGNOSIS — Z23 Encounter for immunization: Secondary | ICD-10-CM | POA: Diagnosis not present

## 2017-08-23 DIAGNOSIS — Z23 Encounter for immunization: Secondary | ICD-10-CM | POA: Diagnosis not present

## 2017-08-23 DIAGNOSIS — Z6830 Body mass index (BMI) 30.0-30.9, adult: Secondary | ICD-10-CM | POA: Diagnosis not present

## 2017-08-23 DIAGNOSIS — M159 Polyosteoarthritis, unspecified: Secondary | ICD-10-CM | POA: Diagnosis not present

## 2017-08-23 DIAGNOSIS — E669 Obesity, unspecified: Secondary | ICD-10-CM | POA: Diagnosis not present

## 2017-08-23 DIAGNOSIS — E790 Hyperuricemia without signs of inflammatory arthritis and tophaceous disease: Secondary | ICD-10-CM | POA: Diagnosis not present

## 2017-08-23 DIAGNOSIS — Z791 Long term (current) use of non-steroidal anti-inflammatories (NSAID): Secondary | ICD-10-CM | POA: Diagnosis not present

## 2017-08-23 DIAGNOSIS — M2550 Pain in unspecified joint: Secondary | ICD-10-CM | POA: Diagnosis not present

## 2018-01-04 DIAGNOSIS — Z1389 Encounter for screening for other disorder: Secondary | ICD-10-CM | POA: Diagnosis not present

## 2018-01-04 DIAGNOSIS — I10 Essential (primary) hypertension: Secondary | ICD-10-CM | POA: Diagnosis not present

## 2018-01-04 DIAGNOSIS — K219 Gastro-esophageal reflux disease without esophagitis: Secondary | ICD-10-CM | POA: Diagnosis not present

## 2018-01-04 DIAGNOSIS — E782 Mixed hyperlipidemia: Secondary | ICD-10-CM | POA: Diagnosis not present

## 2018-01-04 DIAGNOSIS — G894 Chronic pain syndrome: Secondary | ICD-10-CM | POA: Diagnosis not present

## 2018-01-04 DIAGNOSIS — F419 Anxiety disorder, unspecified: Secondary | ICD-10-CM | POA: Diagnosis not present

## 2018-01-04 DIAGNOSIS — E669 Obesity, unspecified: Secondary | ICD-10-CM | POA: Diagnosis not present

## 2018-01-04 DIAGNOSIS — M8580 Other specified disorders of bone density and structure, unspecified site: Secondary | ICD-10-CM | POA: Diagnosis not present

## 2018-01-04 DIAGNOSIS — Z0000 Encounter for general adult medical examination without abnormal findings: Secondary | ICD-10-CM | POA: Diagnosis not present

## 2018-01-04 DIAGNOSIS — R739 Hyperglycemia, unspecified: Secondary | ICD-10-CM | POA: Diagnosis not present

## 2018-01-04 DIAGNOSIS — Z1159 Encounter for screening for other viral diseases: Secondary | ICD-10-CM | POA: Diagnosis not present

## 2018-01-04 DIAGNOSIS — M1990 Unspecified osteoarthritis, unspecified site: Secondary | ICD-10-CM | POA: Diagnosis not present

## 2018-01-04 DIAGNOSIS — E559 Vitamin D deficiency, unspecified: Secondary | ICD-10-CM | POA: Diagnosis not present

## 2018-01-05 ENCOUNTER — Other Ambulatory Visit: Payer: Self-pay | Admitting: Family Medicine

## 2018-01-05 DIAGNOSIS — Z1231 Encounter for screening mammogram for malignant neoplasm of breast: Secondary | ICD-10-CM

## 2018-01-26 ENCOUNTER — Ambulatory Visit
Admission: RE | Admit: 2018-01-26 | Discharge: 2018-01-26 | Disposition: A | Discharge status: Home / Self Care | Payer: Medicare Other | Source: Ambulatory Visit | Attending: Family Medicine | Admitting: Family Medicine

## 2018-01-26 DIAGNOSIS — Z1231 Encounter for screening mammogram for malignant neoplasm of breast: Secondary | ICD-10-CM | POA: Diagnosis not present

## 2018-02-22 DIAGNOSIS — M2550 Pain in unspecified joint: Secondary | ICD-10-CM | POA: Diagnosis not present

## 2018-02-22 DIAGNOSIS — Z6829 Body mass index (BMI) 29.0-29.9, adult: Secondary | ICD-10-CM | POA: Diagnosis not present

## 2018-02-22 DIAGNOSIS — G608 Other hereditary and idiopathic neuropathies: Secondary | ICD-10-CM | POA: Diagnosis not present

## 2018-02-22 DIAGNOSIS — E663 Overweight: Secondary | ICD-10-CM | POA: Diagnosis not present

## 2018-02-22 DIAGNOSIS — E790 Hyperuricemia without signs of inflammatory arthritis and tophaceous disease: Secondary | ICD-10-CM | POA: Diagnosis not present

## 2018-02-22 DIAGNOSIS — M159 Polyosteoarthritis, unspecified: Secondary | ICD-10-CM | POA: Diagnosis not present

## 2018-02-22 DIAGNOSIS — Z791 Long term (current) use of non-steroidal anti-inflammatories (NSAID): Secondary | ICD-10-CM | POA: Diagnosis not present

## 2018-04-15 DIAGNOSIS — H2513 Age-related nuclear cataract, bilateral: Secondary | ICD-10-CM | POA: Diagnosis not present

## 2018-04-15 DIAGNOSIS — H401131 Primary open-angle glaucoma, bilateral, mild stage: Secondary | ICD-10-CM | POA: Diagnosis not present

## 2018-05-06 DIAGNOSIS — H25013 Cortical age-related cataract, bilateral: Secondary | ICD-10-CM | POA: Diagnosis not present

## 2018-05-06 DIAGNOSIS — H401131 Primary open-angle glaucoma, bilateral, mild stage: Secondary | ICD-10-CM | POA: Diagnosis not present

## 2018-06-30 DIAGNOSIS — H25041 Posterior subcapsular polar age-related cataract, right eye: Secondary | ICD-10-CM | POA: Diagnosis not present

## 2018-06-30 DIAGNOSIS — H25811 Combined forms of age-related cataract, right eye: Secondary | ICD-10-CM | POA: Diagnosis not present

## 2018-06-30 DIAGNOSIS — H268 Other specified cataract: Secondary | ICD-10-CM | POA: Diagnosis not present

## 2018-07-11 DIAGNOSIS — Z23 Encounter for immunization: Secondary | ICD-10-CM | POA: Diagnosis not present

## 2018-07-11 DIAGNOSIS — Z6829 Body mass index (BMI) 29.0-29.9, adult: Secondary | ICD-10-CM | POA: Diagnosis not present

## 2018-07-11 DIAGNOSIS — E663 Overweight: Secondary | ICD-10-CM | POA: Diagnosis not present

## 2018-07-11 DIAGNOSIS — E782 Mixed hyperlipidemia: Secondary | ICD-10-CM | POA: Diagnosis not present

## 2018-07-11 DIAGNOSIS — G629 Polyneuropathy, unspecified: Secondary | ICD-10-CM | POA: Diagnosis not present

## 2018-07-11 DIAGNOSIS — R7303 Prediabetes: Secondary | ICD-10-CM | POA: Diagnosis not present

## 2018-07-11 DIAGNOSIS — F419 Anxiety disorder, unspecified: Secondary | ICD-10-CM | POA: Diagnosis not present

## 2018-07-11 DIAGNOSIS — I10 Essential (primary) hypertension: Secondary | ICD-10-CM | POA: Diagnosis not present

## 2018-07-14 DIAGNOSIS — H2512 Age-related nuclear cataract, left eye: Secondary | ICD-10-CM | POA: Diagnosis not present

## 2018-07-14 DIAGNOSIS — H25812 Combined forms of age-related cataract, left eye: Secondary | ICD-10-CM | POA: Diagnosis not present

## 2018-07-27 DIAGNOSIS — Z23 Encounter for immunization: Secondary | ICD-10-CM | POA: Diagnosis not present

## 2018-08-06 ENCOUNTER — Other Ambulatory Visit: Payer: Self-pay | Admitting: Gastroenterology

## 2018-11-11 ENCOUNTER — Other Ambulatory Visit: Payer: Self-pay | Admitting: Family Medicine

## 2018-11-11 DIAGNOSIS — Z1231 Encounter for screening mammogram for malignant neoplasm of breast: Secondary | ICD-10-CM

## 2019-01-30 ENCOUNTER — Ambulatory Visit: Payer: Medicare Other

## 2019-03-21 ENCOUNTER — Ambulatory Visit
Admission: RE | Admit: 2019-03-21 | Discharge: 2019-03-21 | Disposition: A | Discharge status: Home / Self Care | Payer: Medicare Other | Source: Ambulatory Visit | Attending: Family Medicine | Admitting: Family Medicine

## 2019-03-21 DIAGNOSIS — Z1231 Encounter for screening mammogram for malignant neoplasm of breast: Secondary | ICD-10-CM

## 2019-04-05 DIAGNOSIS — E790 Hyperuricemia without signs of inflammatory arthritis and tophaceous disease: Secondary | ICD-10-CM | POA: Diagnosis not present

## 2019-04-05 DIAGNOSIS — M159 Polyosteoarthritis, unspecified: Secondary | ICD-10-CM | POA: Diagnosis not present

## 2019-04-05 DIAGNOSIS — M2550 Pain in unspecified joint: Secondary | ICD-10-CM | POA: Diagnosis not present

## 2019-04-05 DIAGNOSIS — Z791 Long term (current) use of non-steroidal anti-inflammatories (NSAID): Secondary | ICD-10-CM | POA: Diagnosis not present

## 2019-04-05 DIAGNOSIS — G608 Other hereditary and idiopathic neuropathies: Secondary | ICD-10-CM | POA: Diagnosis not present

## 2019-04-05 DIAGNOSIS — E669 Obesity, unspecified: Secondary | ICD-10-CM | POA: Diagnosis not present

## 2019-04-05 DIAGNOSIS — Z6830 Body mass index (BMI) 30.0-30.9, adult: Secondary | ICD-10-CM | POA: Diagnosis not present

## 2019-09-14 ENCOUNTER — Other Ambulatory Visit: Payer: Self-pay | Admitting: Gastroenterology

## 2019-10-21 ENCOUNTER — Other Ambulatory Visit: Payer: Self-pay | Admitting: Gastroenterology

## 2019-10-24 ENCOUNTER — Telehealth: Payer: Self-pay | Admitting: Gastroenterology

## 2019-10-24 MED ORDER — PANTOPRAZOLE SODIUM 40 MG PO TBEC: 40.0000 mg | DELAYED_RELEASE_TABLET | Freq: Every day | ORAL | 3 refills | Status: DC

## 2019-10-24 NOTE — Telephone Encounter (Signed)
Refilled patients Protonix and sent to her local pharmacy

## 2019-10-24 NOTE — Telephone Encounter (Signed)
Patient is requesting refill on Protonix also scheduled her for a follow up 11/27/19.

## 2019-11-27 ENCOUNTER — Other Ambulatory Visit: Payer: Self-pay

## 2019-11-27 ENCOUNTER — Encounter: Payer: Self-pay | Admitting: Gastroenterology

## 2019-11-27 ENCOUNTER — Ambulatory Visit: Payer: Medicare Other | Admitting: Gastroenterology

## 2019-11-27 VITALS — BP 110/70 | HR 68 | Temp 97.9°F | Ht 61.0 in | Wt 164.6 lb

## 2019-11-27 DIAGNOSIS — R1310 Dysphagia, unspecified: Secondary | ICD-10-CM

## 2019-11-27 DIAGNOSIS — K219 Gastro-esophageal reflux disease without esophagitis: Secondary | ICD-10-CM | POA: Diagnosis not present

## 2019-11-27 MED ORDER — PANTOPRAZOLE SODIUM 40 MG PO TBEC: 40.0000 mg | DELAYED_RELEASE_TABLET | Freq: Two times a day (BID) | ORAL | 3 refills | Status: DC

## 2019-11-27 NOTE — Progress Notes (Signed)
[name]    [id number]    Jul 18, 1951  Primary Care Physician:Miller, Lattie Haw, MD  Referring Physician: Kathyrn Lass, MD [place],  [place] [address]   Chief complaint:  GERD, Dysphagia  HPI:  49 yr F here for complaints of solid dysphagia Dysphagia progressively worse in the past 6 months She feels symptoms are worse when she eats green peppers or fried foods. She also avoids meat other than hamburger patty.  She gets chest discomfort and sour taste in the throat along with chocking sensation. Occasional nausea. Denies any vomiting, abdominal pain, melena or bright red blood per rectum   colonoscopy July 2014 was a normal exam. EGD July 2014 showed mild non-obstructive stricture in distal esophagus, 4 cm hiatal hernia, was dilated with Maloney dilator to 50 [place] with some resistance  EGD 04/05/2017:  Hiatal hernia, distal esophageal stricture dilated from 16 to 37mm. Normal stomach and duodenum.   Outpatient Encounter Medications as of 11/27/2019  Medication Sig  . atenolol (TENORMIN) 100 MG tablet Take 100 mg by mouth daily.    Marland Kitchen atorvastatin (LIPITOR) 40 MG tablet Take 1 tablet by mouth daily.  . calcium carbonate (TUMS EX) 750 MG chewable tablet Chew 1 tablet by mouth as needed for heartburn.  Marland Kitchen CALCIUM-VITAMIN D PO Take 1 capsule by mouth daily as needed. 1000 IU daily  . cyclobenzaprine (FLEXERIL) 10 MG tablet Take 10 mg by mouth 3 (three) times daily as needed.    . DULoxetine (CYMBALTA) 60 MG capsule Take 1 capsule by mouth daily.  Marland Kitchen gabapentin (NEURONTIN) 300 MG capsule Use as directed 600 mg in the mouth or throat as needed. Take 1/2 tablet as needed  . hydrochlorothiazide (HYDRODIURIL) 25 MG tablet Take 1 tablet by mouth daily.  Marland Kitchen latanoprost (XALATAN) 0.005 % ophthalmic solution Apply 1 drop to eye at bedtime.    Marland Kitchen LORazepam (ATIVAN) 0.5 MG tablet Take 1/2-1 tablet by mouth every 4-6 hours as needed for anxiety   . meloxicam (MOBIC)  15 MG tablet Take 1 tablet by mouth daily.  . pantoprazole (PROTONIX) 40 MG tablet TAKE 1 TABLET BY MOUTH  DAILY  . pantoprazole (PROTONIX) 40 MG tablet Take 1 tablet (40 mg total) by mouth daily.  . [DISCONTINUED] ranitidine (ZANTAC) 150 MG tablet Take 150 mg by mouth as needed for heartburn.  . [DISCONTINUED] 0.9 %  sodium chloride infusion    No facility-administered encounter medications on file as of 11/27/2019.    Allergies as of 11/27/2019 - Review Complete 11/27/2019  Allergen Reaction Noted  . Lisinopril Cough 01/09/2011  . Losartan Cough 11/27/2019  . Clindamycin/lincomycin Rash 11/27/2019    Past Medical History:  Diagnosis Date  . Anomalous left coronary artery   . Anxiety   . Fibromyalgia   . Hepatic cyst 11/18/10  . Hiatal hernia 11/18/10  . Hyperlipidemia   . Hyperplastic colon polyp   . Hypertension   . MVP (mitral valve prolapse)   . Obesity   . Osteoarthritis   . RA (rheumatoid arthritis) ([place])   . Thyroid nodule    left    Past Surgical History:  Procedure Laterality Date  . axillary breast tissue excision  11/1998  . CESAREAN SECTION    . FOOT SURGERY Left   . NASAL SEPTUM SURGERY    . RHINOPLASTY    . TUBAL LIGATION    . VAGINAL HYSTERECTOMY     partial  Family History  Problem Relation Age of Onset  . Breast cancer Maternal Aunt   . Heart disease Father   . Emphysema Father        hx of smoking  . Diabetes Maternal Aunt   . Stroke Mother   . COPD Mother        former smoker  . Colon cancer Neg Hx   . Esophageal cancer Neg Hx   . Pancreatic cancer Neg Hx   . Liver disease Neg Hx     Social History   Socioeconomic History  . Marital status: Divorced    Spouse name: Not on file  . Number of children: 1  . Years of education: Not on file  . Highest education level: Not on file  Occupational History  . Occupation: Press photographer -retired    Fish farm manager: [employer]  Tobacco Use  . Smoking status: Never Smoker  . Smokeless tobacco: Never Used    Substance and Sexual Activity  . Alcohol use: Yes    Comment: couple drinks wine or beer per week   . Drug use: No  . Sexual activity: Not on file  Other Topics Concern  . Not on file  Social History Narrative  . Not on file   Social Determinants of Health   Financial Resource Strain:   . Difficulty of Paying Living Expenses: Not on file  Food Insecurity:   . Worried About Charity fundraiser in the Last Year: Not on file  . Ran Out of Food in the Last Year: Not on file  Transportation Needs:   . Lack of Transportation (Medical): Not on file  . Lack of Transportation (Non-Medical): Not on file  Physical Activity:   . Days of Exercise per Week: Not on file  . Minutes of Exercise per Session: Not on file  Stress:   . Feeling of Stress : Not on file  Social Connections:   . Frequency of Communication with Friends and Family: Not on file  . Frequency of Social Gatherings with Friends and Family: Not on file  . Attends Religious Services: Not on file  . Active Member of Clubs or Organizations: Not on file  . Attends Archivist Meetings: Not on file  . Marital Status: Not on file  Intimate Partner Violence:   . Fear of Current or Ex-Partner: Not on file  . Emotionally Abused: Not on file  . Physically Abused: Not on file  . Sexually Abused: Not on file      Review of systems: All other review of systems negative except as mentioned in the HPI.   Physical Exam: Vitals:   11/27/19 1319  BP: 110/70  Pulse: 68  Temp: 97.9 F (36.6 C)   Body mass index is 31.1 kg/m. Gen:      No acute distress Neuro: alert and oriented x 3 Psych: normal mood and affect  Data Reviewed:  Reviewed labs, radiology imaging, old records and pertinent past GI work up   Assessment and Plan/Recommendations: 89 yr F with GERD and solid dysphagia  Schedule for EGD with esophageal dilation and esophageal biopsies if needed The risks and benefits as well as alternatives of  endoscopic procedure(s) have been discussed and reviewed. All questions answered. The patient agrees to proceed.  GERD: Increase Protonix to 40mg  BID Antireflux measures  Return in 3 months   The patient was provided an opportunity to ask questions and all were answered. The patient agreed with the plan and demonstrated an understanding  of the instructions.  Damaris Hippo , MD    CC: Kathyrn Lass, MD

## 2019-11-27 NOTE — Patient Instructions (Signed)
You have been scheduled for an endoscopy. Please follow written instructions given to you at your visit today. If you use inhalers (even only as needed), please bring them with you on the day of your procedure.   We have sent Protonix to [facility]  Follow up in 3 months  If you are age 68 or older, your body mass index should be between 23-30. Your Body mass index is 31.1 kg/m. If this is out of the aforementioned range listed, please consider follow up with your Primary Care Provider.  If you are age 82 or younger, your body mass index should be between 19-25. Your Body mass index is 31.1 kg/m. If this is out of the aformentioned range listed, please consider follow up with your Primary Care Provider.    I appreciate the  opportunity to care for you  Thank You   Harl Bowie , MD

## 2019-12-01 ENCOUNTER — Encounter: Payer: Self-pay | Admitting: Gastroenterology

## 2020-01-04 ENCOUNTER — Encounter: Payer: Medicare Other | Admitting: Gastroenterology

## 2020-01-30 ENCOUNTER — Encounter: Payer: Self-pay | Admitting: Gastroenterology

## 2020-02-05 ENCOUNTER — Ambulatory Visit (AMBULATORY_SURGERY_CENTER): Payer: Medicare Other | Admitting: Gastroenterology

## 2020-02-05 ENCOUNTER — Encounter: Payer: Self-pay | Admitting: Gastroenterology

## 2020-02-05 ENCOUNTER — Other Ambulatory Visit: Payer: Self-pay

## 2020-02-05 VITALS — BP 108/63 | HR 61 | Temp 97.3°F | Resp 10 | Ht 61.0 in | Wt 164.0 lb

## 2020-02-05 DIAGNOSIS — K3189 Other diseases of stomach and duodenum: Secondary | ICD-10-CM

## 2020-02-05 DIAGNOSIS — R1310 Dysphagia, unspecified: Secondary | ICD-10-CM | POA: Diagnosis not present

## 2020-02-05 DIAGNOSIS — K2100 Gastro-esophageal reflux disease with esophagitis, without bleeding: Secondary | ICD-10-CM | POA: Diagnosis not present

## 2020-02-05 DIAGNOSIS — K449 Diaphragmatic hernia without obstruction or gangrene: Secondary | ICD-10-CM

## 2020-02-05 MED ORDER — SODIUM CHLORIDE 0.9 % IV SOLN: 500.0000 mL | INTRAVENOUS | Status: DC

## 2020-02-05 NOTE — Progress Notes (Signed)
Called to room to assist during endoscopic procedure.  Patient ID and intended procedure confirmed with present staff. Received instructions for my participation in the procedure from the performing physician.  

## 2020-02-05 NOTE — Progress Notes (Signed)
Per [employer], pt's vocal cords has liquid on them.  He performed jaw thrust and pt was suctioned.  After she was more awke pt was breathing on her own and stable.  HOB fowlers position.  Maw  No further problems noted in the recovery room. maw

## 2020-02-05 NOTE — Op Note (Signed)
[facility] Patient Name: [name] Procedure Date: 02/05/2020 1:53 PM MRN: [id number] Endoscopist: Mauri Pole , MD Age: 69 Referring MD:  Date of Birth: 1951-05-19 Gender: Female Account #: [id number] Procedure:                Upper GI endoscopy Indications:              Dysphagia Medicines:                Monitored Anesthesia Care Procedure:                Pre-Anesthesia Assessment:                           - Prior to the procedure, a History and Physical                            was performed, and patient medications and                            allergies were reviewed. The patient's tolerance of                            previous anesthesia was also reviewed. The risks                            and benefits of the procedure and the sedation                            options and risks were discussed with the patient.                            All questions were answered, and informed consent                            was obtained. Prior Anticoagulants: The patient has                            taken no previous anticoagulant or antiplatelet                            agents. ASA Grade Assessment: II - A patient with                            mild systemic disease. After reviewing the risks                            and benefits, the patient was deemed in                            satisfactory condition to undergo the procedure.                           After obtaining informed consent, the endoscope was  passed under direct vision. Throughout the                            procedure, the patient's blood pressure, pulse, and                            oxygen saturations were monitored continuously. The                            Endoscope was introduced through the mouth, and                            advanced to the second part of duodenum. The upper                            GI endoscopy was accomplished without  difficulty.                            The patient tolerated the procedure well. Scope In: Scope Out: Findings:                 LA Grade A (one or more mucosal breaks less than 5                            mm, not extending between tops of 2 mucosal folds)                            esophagitis was found 34 to 36 cm from the                            incisors. Biopsies were taken with a cold forceps                            for histology.                           The lumen of the middle third of the esophagus was                            mildly dilated with tortous distal esophagus. No                            obvious stricture or ring in distal esophagus.                            Small diverticul near EG junction.                           A 6 cm hiatal hernia was present.                           Two cratered gastric ulcers with no stigmata of  bleeding were found in the cardia ( Cameron's                            ulcers) The largest lesion was 4 mm in largest                            dimension. Biopsies were taken with a cold forceps                            for histology.                           The examined duodenum was normal. Complications:            No immediate complications. Estimated Blood Loss:     Estimated blood loss was minimal. Impression:               - LA Grade A reflux esophagitis. Biopsied.                           - Dilation in the middle third of the esophagus                            with tortous distal esophagus.                           - 6 cm hiatal hernia.                           - Gastric ulcers with no stigmata of bleeding.                            Biopsied.                           - Normal examined duodenum. Recommendation:           - Resume previous diet.                           - Continue present medications.                           - Await pathology results.                           - No  aspirin, ibuprofen, naproxen, or other                            non-steroidal anti-inflammatory drugs.                           - Follow an antireflux regimen indefinitely.                           - Schedule esophageal manometry and 24hr pH  impedance on PPI to further evaluate dysphagia and                            GERD symptoms Mauri Pole, MD 02/05/2020 2:34:35 PM This report has been signed electronically.

## 2020-02-05 NOTE — Progress Notes (Signed)
1435 Pt experiencing larygeal spasm with jaw thrust performed, O sat returned to > 90% on RA, vss Zofran 4mg  given

## 2020-02-05 NOTE — Progress Notes (Signed)
Report given to PACU, vss 

## 2020-02-05 NOTE — Patient Instructions (Addendum)
YOU HAD AN ENDOSCOPIC PROCEDURE TODAY AT [facility]:   Refer to the procedure report that was given to you for any specific questions about what was found during the examination.  If the procedure report does not answer your questions, please call your gastroenterologist to clarify.  If you requested that your care partner not be given the details of your procedure findings, then the procedure report has been included in a sealed envelope for you to review at your convenience later.  YOU SHOULD EXPECT: Some feelings of bloating in the abdomen. Passage of more gas than usual.  Walking can help get rid of the air that was put into your GI tract during the procedure and reduce the bloating. If you had a lower endoscopy (such as a colonoscopy or flexible sigmoidoscopy) you may notice spotting of blood in your stool or on the toilet paper. If you underwent a bowel prep for your procedure, you may not have a normal bowel movement for a few days.  Please Note:  You might notice some irritation and congestion in your nose or some drainage.  This is from the oxygen used during your procedure.  There is no need for concern and it should clear up in a day or so.  SYMPTOMS TO REPORT IMMEDIATELY:     Following upper endoscopy (EGD)  Vomiting of blood or coffee ground material  New chest pain or pain under the shoulder blades  Painful or persistently difficult swallowing  New shortness of breath  Fever of 100F or higher  Black, tarry-looking stools  For urgent or emergent issues, a gastroenterologist can be reached at any hour by calling [phone]. Do not use MyChart messaging for urgent concerns.    DIET:  We do recommend a small meal at first, but then you may proceed to your regular diet.  Drink plenty of fluids but you should avoid alcoholic beverages for 24 hours.  ACTIVITY:  You should plan to take it easy for the rest of today and you should NOT DRIVE or use heavy machinery  until tomorrow (because of the sedation medicines used during the test).    FOLLOW UP: Our staff will call the number listed on your records 48-72 hours following your procedure to check on you and address any questions or concerns that you may have regarding the information given to you following your procedure. If we do not reach you, we will leave a message.  We will attempt to reach you two times.  During this call, we will ask if you have developed any symptoms of COVID 19. If you develop any symptoms (ie: fever, flu-like symptoms, shortness of breath, cough etc.) before then, please call [phone].  If you test positive for Covid 19 in the 2 weeks post procedure, please call and report this information to [place].    If any biopsies were taken you will be contacted by phone or by letter within the next 1-3 weeks.  Please call us at [phone] if you have not heard about the biopsies in 3 weeks.    SIGNATURES/CONFIDENTIALITY: You and/or your care partner have signed paperwork which will be entered into your electronic medical record.  These signatures attest to the fact that that the information above on your After Visit Summary has been reviewed and is understood.  Full responsibility of the confidentiality of this discharge information lies with you and/or your care-partner.     Handouts were given to you on GERD. Hiatal  Hernia, & Esophagitis. NO ASPIRIN, ASPIRIN CONTAINING PRODUCTS (BC OR GOODY POWDERS) OR NSAIDS (IBUPROFEN, ADVIL, ALEVE, AND MOTRIN) FOR ; TYLENOL IS OK TO TAKE. Follow antireflux regimen indefinitely. The office will call you to schedule a manometry and 24 hr pH impedance on PPI to further evaluate dysphagia and GERD symptoms. You may resume your current medications today. Await biopsy results. Please call if any questions or concerns.

## 2020-02-07 ENCOUNTER — Telehealth: Payer: Self-pay

## 2020-02-07 NOTE — Telephone Encounter (Signed)
  Follow up Call-  Call back number 02/05/2020  Post procedure Call Back phone  # [phone]  Permission to leave phone message Yes  Some recent data might be hidden     Patient questions:  Do you have a fever, pain , or abdominal swelling? No. Pain Score  0 *  Have you tolerated food without any problems? Yes.    Have you been able to return to your normal activities? Yes.    Do you have any questions about your discharge instructions: Diet   No. Medications  No. Follow up visit  No.  Do you have questions or concerns about your Care? No.  Actions: * If pain score is 4 or above: 1. No action needed, pain <4.Have you developed a fever since your procedure? no  2.   Have you had an respiratory symptoms (SOB or cough) since your procedure? non  3.   Have you tested positive for COVID 19 since your procedure o  4.   Have you had any family members/close contacts diagnosed with the COVID 19 since your procedure?  no   If yes to any of these questions please route to Joylene John, RN and Erenest Rasher, RN

## 2020-02-08 ENCOUNTER — Telehealth: Payer: Self-pay

## 2020-02-08 NOTE — Telephone Encounter (Signed)
Patient pointed out that the 24hr Ph study will require removal of the probe and this date is a Friday. I will call scheduling again and make certain of the plan.

## 2020-02-08 NOTE — Telephone Encounter (Signed)
Patient called needs to make some changes to her appts

## 2020-02-08 NOTE — Telephone Encounter (Signed)
Per procedure note the patient is to be scheduled for an esophageal manometry with 24hr Ph impedance on PPI. Appointments  COVID screening 03/19/20 at 10:30 am Manometry 03/22/20 at 8:30am Called to discuss with the patient. No answer. Left a message on her voicemail asking she call me back to discuss.

## 2020-02-09 ENCOUNTER — Other Ambulatory Visit: Payer: Self-pay

## 2020-02-09 NOTE — Telephone Encounter (Signed)
Appointment moved to a Wednesday July 14 at 10:30am. Message left for the patient with date and time. Written information will be mailed to her today.

## 2020-02-15 ENCOUNTER — Encounter: Payer: Self-pay | Admitting: Gastroenterology

## 2020-03-01 ENCOUNTER — Telehealth: Payer: Self-pay | Admitting: Gastroenterology

## 2020-03-01 NOTE — Telephone Encounter (Signed)
Patient calling in reference to her covid test appt she states it would need to be rescheduled being that it is 3 weeks out from her procedure.

## 2020-03-01 NOTE — Telephone Encounter (Signed)
Covid test moved to 04/06/20@9 :40am, pt aware.

## 2020-03-19 ENCOUNTER — Other Ambulatory Visit (HOSPITAL_COMMUNITY): Payer: Medicare Other

## 2020-04-06 ENCOUNTER — Other Ambulatory Visit (HOSPITAL_COMMUNITY)
Admission: RE | Admit: 2020-04-06 | Discharge: 2020-04-06 | Disposition: A | Discharge status: Home / Self Care | Payer: Medicare Other | Source: Ambulatory Visit | Attending: Gastroenterology | Admitting: Gastroenterology

## 2020-04-06 DIAGNOSIS — Z01812 Encounter for preprocedural laboratory examination: Secondary | ICD-10-CM | POA: Insufficient documentation

## 2020-04-06 DIAGNOSIS — Z20822 Contact with and (suspected) exposure to covid-19: Secondary | ICD-10-CM | POA: Diagnosis not present

## 2020-04-06 LAB — SARS CORONAVIRUS 2 (TAT 6-24 HRS): SARS Coronavirus 2: NEGATIVE

## 2020-04-10 ENCOUNTER — Ambulatory Visit (HOSPITAL_COMMUNITY)
Admission: RE | Admit: 2020-04-10 | Discharge: 2020-04-10 | Disposition: A | Discharge status: Home / Self Care | Payer: Medicare Other | Attending: Gastroenterology | Admitting: Gastroenterology

## 2020-04-10 ENCOUNTER — Encounter (HOSPITAL_COMMUNITY)
Admission: RE | Disposition: A | Discharge status: Home / Self Care | Payer: Self-pay | Source: Home / Self Care | Attending: Gastroenterology

## 2020-04-10 DIAGNOSIS — R1110 Vomiting, unspecified: Secondary | ICD-10-CM

## 2020-04-10 DIAGNOSIS — R110 Nausea: Secondary | ICD-10-CM | POA: Diagnosis not present

## 2020-04-10 DIAGNOSIS — K449 Diaphragmatic hernia without obstruction or gangrene: Secondary | ICD-10-CM | POA: Diagnosis not present

## 2020-04-10 DIAGNOSIS — K219 Gastro-esophageal reflux disease without esophagitis: Secondary | ICD-10-CM | POA: Diagnosis present

## 2020-04-10 DIAGNOSIS — R112 Nausea with vomiting, unspecified: Secondary | ICD-10-CM | POA: Insufficient documentation

## 2020-04-10 HISTORY — PX: 24 HOUR PH STUDY: SHX5419

## 2020-04-10 HISTORY — PX: PH IMPEDANCE STUDY: SHX5565

## 2020-04-10 HISTORY — PX: ESOPHAGEAL MANOMETRY: SHX5429

## 2020-04-10 SURGERY — MANOMETRY, ESOPHAGUS
Anesthesia: Choice

## 2020-04-10 MED ORDER — LIDOCAINE VISCOUS HCL 2 % MT SOLN
OROMUCOSAL | Status: AC
  Filled 2020-04-10: qty 15

## 2020-04-10 SURGICAL SUPPLY — 2 items
FACESHIELD LNG OPTICON STERILE (SAFETY) IMPLANT
GLOVE BIO SURGEON STRL SZ8 (GLOVE) ×6 IMPLANT

## 2020-04-10 NOTE — Progress Notes (Signed)
Esophageal manometry performed per protocol without complications.  Patient tolerated well.  PH probe placed per protocol without complications.  Patient tolerated well.  Probe placed at 28 cm.  Education given.  Patient verbalized understanding.  Patient to return tomorrow at 1120 to have probe removed.  Patient verbalized understanding.

## 2020-04-12 ENCOUNTER — Encounter (HOSPITAL_COMMUNITY): Payer: Self-pay | Admitting: Gastroenterology

## 2020-04-16 DIAGNOSIS — K219 Gastro-esophageal reflux disease without esophagitis: Secondary | ICD-10-CM

## 2020-04-16 DIAGNOSIS — R1110 Vomiting, unspecified: Secondary | ICD-10-CM

## 2020-04-16 DIAGNOSIS — R110 Nausea: Secondary | ICD-10-CM

## 2020-07-12 ENCOUNTER — Other Ambulatory Visit: Payer: Self-pay | Admitting: Family Medicine

## 2020-07-12 DIAGNOSIS — Z1231 Encounter for screening mammogram for malignant neoplasm of breast: Secondary | ICD-10-CM

## 2020-07-22 ENCOUNTER — Ambulatory Visit: Payer: Medicare Other | Admitting: Gastroenterology

## 2020-08-08 ENCOUNTER — Other Ambulatory Visit: Payer: Self-pay

## 2020-08-08 ENCOUNTER — Ambulatory Visit
Admission: RE | Admit: 2020-08-08 | Discharge: 2020-08-08 | Disposition: A | Discharge status: Home / Self Care | Payer: Medicare Other | Source: Ambulatory Visit | Attending: Family Medicine | Admitting: Family Medicine

## 2020-08-08 DIAGNOSIS — Z1231 Encounter for screening mammogram for malignant neoplasm of breast: Secondary | ICD-10-CM

## 2020-08-12 ENCOUNTER — Other Ambulatory Visit: Payer: Self-pay | Admitting: Sports Medicine

## 2020-08-12 DIAGNOSIS — M5416 Radiculopathy, lumbar region: Secondary | ICD-10-CM

## 2020-08-31 ENCOUNTER — Other Ambulatory Visit: Payer: Self-pay

## 2020-08-31 ENCOUNTER — Ambulatory Visit
Admission: RE | Admit: 2020-08-31 | Discharge: 2020-08-31 | Disposition: A | Discharge status: Home / Self Care | Payer: Medicare Other | Source: Ambulatory Visit | Attending: Sports Medicine | Admitting: Sports Medicine

## 2020-08-31 DIAGNOSIS — M5416 Radiculopathy, lumbar region: Secondary | ICD-10-CM

## 2020-10-10 DIAGNOSIS — M5416 Radiculopathy, lumbar region: Secondary | ICD-10-CM | POA: Diagnosis not present

## 2020-10-10 DIAGNOSIS — M461 Sacroiliitis, not elsewhere classified: Secondary | ICD-10-CM | POA: Diagnosis not present

## 2020-10-28 DIAGNOSIS — M5416 Radiculopathy, lumbar region: Secondary | ICD-10-CM | POA: Diagnosis not present

## 2020-11-04 DIAGNOSIS — K219 Gastro-esophageal reflux disease without esophagitis: Secondary | ICD-10-CM | POA: Diagnosis not present

## 2020-11-04 DIAGNOSIS — M8580 Other specified disorders of bone density and structure, unspecified site: Secondary | ICD-10-CM | POA: Diagnosis not present

## 2020-11-04 DIAGNOSIS — I10 Essential (primary) hypertension: Secondary | ICD-10-CM | POA: Diagnosis not present

## 2020-11-04 DIAGNOSIS — E782 Mixed hyperlipidemia: Secondary | ICD-10-CM | POA: Diagnosis not present

## 2020-11-04 DIAGNOSIS — M069 Rheumatoid arthritis, unspecified: Secondary | ICD-10-CM | POA: Diagnosis not present

## 2020-11-04 DIAGNOSIS — M1990 Unspecified osteoarthritis, unspecified site: Secondary | ICD-10-CM | POA: Diagnosis not present

## 2020-12-07 ENCOUNTER — Other Ambulatory Visit: Payer: Self-pay | Admitting: Gastroenterology

## 2021-01-07 DIAGNOSIS — I10 Essential (primary) hypertension: Secondary | ICD-10-CM | POA: Diagnosis not present

## 2021-01-07 DIAGNOSIS — E1169 Type 2 diabetes mellitus with other specified complication: Secondary | ICD-10-CM | POA: Diagnosis not present

## 2021-01-07 DIAGNOSIS — E782 Mixed hyperlipidemia: Secondary | ICD-10-CM | POA: Diagnosis not present

## 2021-02-03 DIAGNOSIS — M8580 Other specified disorders of bone density and structure, unspecified site: Secondary | ICD-10-CM | POA: Diagnosis not present

## 2021-02-03 DIAGNOSIS — I10 Essential (primary) hypertension: Secondary | ICD-10-CM | POA: Diagnosis not present

## 2021-02-03 DIAGNOSIS — M069 Rheumatoid arthritis, unspecified: Secondary | ICD-10-CM | POA: Diagnosis not present

## 2021-02-03 DIAGNOSIS — M1990 Unspecified osteoarthritis, unspecified site: Secondary | ICD-10-CM | POA: Diagnosis not present

## 2021-02-03 DIAGNOSIS — E1169 Type 2 diabetes mellitus with other specified complication: Secondary | ICD-10-CM | POA: Diagnosis not present

## 2021-02-03 DIAGNOSIS — K219 Gastro-esophageal reflux disease without esophagitis: Secondary | ICD-10-CM | POA: Diagnosis not present

## 2021-02-03 DIAGNOSIS — E782 Mixed hyperlipidemia: Secondary | ICD-10-CM | POA: Diagnosis not present

## 2021-02-13 DIAGNOSIS — H2000 Unspecified acute and subacute iridocyclitis: Secondary | ICD-10-CM | POA: Diagnosis not present

## 2021-02-27 DIAGNOSIS — H2000 Unspecified acute and subacute iridocyclitis: Secondary | ICD-10-CM | POA: Diagnosis not present

## 2021-04-02 DIAGNOSIS — H20023 Recurrent acute iridocyclitis, bilateral: Secondary | ICD-10-CM | POA: Diagnosis not present

## 2021-04-13 DIAGNOSIS — K219 Gastro-esophageal reflux disease without esophagitis: Secondary | ICD-10-CM | POA: Diagnosis not present

## 2021-04-13 DIAGNOSIS — E782 Mixed hyperlipidemia: Secondary | ICD-10-CM | POA: Diagnosis not present

## 2021-04-13 DIAGNOSIS — I10 Essential (primary) hypertension: Secondary | ICD-10-CM | POA: Diagnosis not present

## 2021-04-13 DIAGNOSIS — M069 Rheumatoid arthritis, unspecified: Secondary | ICD-10-CM | POA: Diagnosis not present

## 2021-04-13 DIAGNOSIS — M8580 Other specified disorders of bone density and structure, unspecified site: Secondary | ICD-10-CM | POA: Diagnosis not present

## 2021-04-13 DIAGNOSIS — M1990 Unspecified osteoarthritis, unspecified site: Secondary | ICD-10-CM | POA: Diagnosis not present

## 2021-04-13 DIAGNOSIS — E1169 Type 2 diabetes mellitus with other specified complication: Secondary | ICD-10-CM | POA: Diagnosis not present

## 2021-04-29 DIAGNOSIS — Z961 Presence of intraocular lens: Secondary | ICD-10-CM | POA: Diagnosis not present

## 2021-04-29 DIAGNOSIS — H52203 Unspecified astigmatism, bilateral: Secondary | ICD-10-CM | POA: Diagnosis not present

## 2021-04-29 DIAGNOSIS — H401131 Primary open-angle glaucoma, bilateral, mild stage: Secondary | ICD-10-CM | POA: Diagnosis not present

## 2021-05-01 DIAGNOSIS — E790 Hyperuricemia without signs of inflammatory arthritis and tophaceous disease: Secondary | ICD-10-CM | POA: Diagnosis not present

## 2021-05-01 DIAGNOSIS — R768 Other specified abnormal immunological findings in serum: Secondary | ICD-10-CM | POA: Diagnosis not present

## 2021-05-01 DIAGNOSIS — H209 Unspecified iridocyclitis: Secondary | ICD-10-CM | POA: Diagnosis not present

## 2021-05-01 DIAGNOSIS — R5383 Other fatigue: Secondary | ICD-10-CM | POA: Diagnosis not present

## 2021-05-01 DIAGNOSIS — M5432 Sciatica, left side: Secondary | ICD-10-CM | POA: Diagnosis not present

## 2021-05-01 DIAGNOSIS — M159 Polyosteoarthritis, unspecified: Secondary | ICD-10-CM | POA: Diagnosis not present

## 2021-05-01 DIAGNOSIS — M2550 Pain in unspecified joint: Secondary | ICD-10-CM | POA: Diagnosis not present

## 2021-05-06 DIAGNOSIS — H401131 Primary open-angle glaucoma, bilateral, mild stage: Secondary | ICD-10-CM | POA: Diagnosis not present

## 2021-06-17 DIAGNOSIS — I10 Essential (primary) hypertension: Secondary | ICD-10-CM | POA: Diagnosis not present

## 2021-06-17 DIAGNOSIS — E782 Mixed hyperlipidemia: Secondary | ICD-10-CM | POA: Diagnosis not present

## 2021-06-17 DIAGNOSIS — K219 Gastro-esophageal reflux disease without esophagitis: Secondary | ICD-10-CM | POA: Diagnosis not present

## 2021-06-17 DIAGNOSIS — E1169 Type 2 diabetes mellitus with other specified complication: Secondary | ICD-10-CM | POA: Diagnosis not present

## 2021-06-17 DIAGNOSIS — M1990 Unspecified osteoarthritis, unspecified site: Secondary | ICD-10-CM | POA: Diagnosis not present

## 2021-06-17 DIAGNOSIS — M8580 Other specified disorders of bone density and structure, unspecified site: Secondary | ICD-10-CM | POA: Diagnosis not present

## 2021-06-17 DIAGNOSIS — M069 Rheumatoid arthritis, unspecified: Secondary | ICD-10-CM | POA: Diagnosis not present

## 2021-06-19 DIAGNOSIS — H20023 Recurrent acute iridocyclitis, bilateral: Secondary | ICD-10-CM | POA: Diagnosis not present

## 2021-07-03 ENCOUNTER — Other Ambulatory Visit: Payer: Self-pay | Admitting: Family Medicine

## 2021-07-03 DIAGNOSIS — Z1231 Encounter for screening mammogram for malignant neoplasm of breast: Secondary | ICD-10-CM

## 2021-07-21 DIAGNOSIS — H401131 Primary open-angle glaucoma, bilateral, mild stage: Secondary | ICD-10-CM | POA: Diagnosis not present

## 2021-07-21 DIAGNOSIS — H20023 Recurrent acute iridocyclitis, bilateral: Secondary | ICD-10-CM | POA: Diagnosis not present

## 2021-07-30 DIAGNOSIS — Z0000 Encounter for general adult medical examination without abnormal findings: Secondary | ICD-10-CM | POA: Diagnosis not present

## 2021-07-30 DIAGNOSIS — Z23 Encounter for immunization: Secondary | ICD-10-CM | POA: Diagnosis not present

## 2021-08-05 ENCOUNTER — Other Ambulatory Visit: Payer: Self-pay | Admitting: Family Medicine

## 2021-08-05 DIAGNOSIS — M8580 Other specified disorders of bone density and structure, unspecified site: Secondary | ICD-10-CM

## 2021-08-05 DIAGNOSIS — Z791 Long term (current) use of non-steroidal anti-inflammatories (NSAID): Secondary | ICD-10-CM | POA: Diagnosis not present

## 2021-08-05 DIAGNOSIS — M2550 Pain in unspecified joint: Secondary | ICD-10-CM | POA: Diagnosis not present

## 2021-08-05 DIAGNOSIS — M159 Polyosteoarthritis, unspecified: Secondary | ICD-10-CM | POA: Diagnosis not present

## 2021-08-05 DIAGNOSIS — H209 Unspecified iridocyclitis: Secondary | ICD-10-CM | POA: Diagnosis not present

## 2021-08-05 DIAGNOSIS — E790 Hyperuricemia without signs of inflammatory arthritis and tophaceous disease: Secondary | ICD-10-CM | POA: Diagnosis not present

## 2021-08-05 DIAGNOSIS — R768 Other specified abnormal immunological findings in serum: Secondary | ICD-10-CM | POA: Diagnosis not present

## 2021-08-11 ENCOUNTER — Ambulatory Visit: Payer: Medicare Other

## 2021-08-12 DIAGNOSIS — H401131 Primary open-angle glaucoma, bilateral, mild stage: Secondary | ICD-10-CM | POA: Diagnosis not present

## 2021-09-08 DIAGNOSIS — E1169 Type 2 diabetes mellitus with other specified complication: Secondary | ICD-10-CM | POA: Diagnosis not present

## 2021-09-08 DIAGNOSIS — G629 Polyneuropathy, unspecified: Secondary | ICD-10-CM | POA: Diagnosis not present

## 2021-09-08 DIAGNOSIS — I10 Essential (primary) hypertension: Secondary | ICD-10-CM | POA: Diagnosis not present

## 2021-09-08 DIAGNOSIS — E782 Mixed hyperlipidemia: Secondary | ICD-10-CM | POA: Diagnosis not present

## 2021-11-05 DIAGNOSIS — M2550 Pain in unspecified joint: Secondary | ICD-10-CM | POA: Diagnosis not present

## 2021-11-05 DIAGNOSIS — Z791 Long term (current) use of non-steroidal anti-inflammatories (NSAID): Secondary | ICD-10-CM | POA: Diagnosis not present

## 2021-11-05 DIAGNOSIS — M159 Polyosteoarthritis, unspecified: Secondary | ICD-10-CM | POA: Diagnosis not present

## 2021-11-05 DIAGNOSIS — R768 Other specified abnormal immunological findings in serum: Secondary | ICD-10-CM | POA: Diagnosis not present

## 2021-11-05 DIAGNOSIS — E790 Hyperuricemia without signs of inflammatory arthritis and tophaceous disease: Secondary | ICD-10-CM | POA: Diagnosis not present

## 2021-11-05 DIAGNOSIS — Z79899 Other long term (current) drug therapy: Secondary | ICD-10-CM | POA: Diagnosis not present

## 2022-01-08 ENCOUNTER — Other Ambulatory Visit: Payer: Medicare Other

## 2022-01-08 ENCOUNTER — Ambulatory Visit
Admission: RE | Admit: 2022-01-08 | Discharge: 2022-01-08 | Disposition: A | Discharge status: Home / Self Care | Payer: Medicare Other | Source: Ambulatory Visit | Attending: Family Medicine | Admitting: Family Medicine

## 2022-01-08 DIAGNOSIS — Z1231 Encounter for screening mammogram for malignant neoplasm of breast: Secondary | ICD-10-CM | POA: Diagnosis not present

## 2022-02-06 DIAGNOSIS — H401131 Primary open-angle glaucoma, bilateral, mild stage: Secondary | ICD-10-CM | POA: Diagnosis not present

## 2022-03-09 DIAGNOSIS — G4710 Hypersomnia, unspecified: Secondary | ICD-10-CM | POA: Diagnosis not present

## 2022-03-09 DIAGNOSIS — Z23 Encounter for immunization: Secondary | ICD-10-CM | POA: Diagnosis not present

## 2022-03-09 DIAGNOSIS — I10 Essential (primary) hypertension: Secondary | ICD-10-CM | POA: Diagnosis not present

## 2022-03-09 DIAGNOSIS — E782 Mixed hyperlipidemia: Secondary | ICD-10-CM | POA: Diagnosis not present

## 2022-03-09 DIAGNOSIS — M1990 Unspecified osteoarthritis, unspecified site: Secondary | ICD-10-CM | POA: Diagnosis not present

## 2022-03-09 DIAGNOSIS — R7303 Prediabetes: Secondary | ICD-10-CM | POA: Diagnosis not present

## 2022-03-14 ENCOUNTER — Other Ambulatory Visit: Payer: Self-pay | Admitting: Gastroenterology

## 2022-03-24 DIAGNOSIS — M25562 Pain in left knee: Secondary | ICD-10-CM | POA: Diagnosis not present

## 2022-03-24 DIAGNOSIS — G8929 Other chronic pain: Secondary | ICD-10-CM | POA: Diagnosis not present

## 2022-03-24 DIAGNOSIS — M1712 Unilateral primary osteoarthritis, left knee: Secondary | ICD-10-CM | POA: Diagnosis not present

## 2022-05-06 DIAGNOSIS — R768 Other specified abnormal immunological findings in serum: Secondary | ICD-10-CM | POA: Diagnosis not present

## 2022-05-06 DIAGNOSIS — E790 Hyperuricemia without signs of inflammatory arthritis and tophaceous disease: Secondary | ICD-10-CM | POA: Diagnosis not present

## 2022-05-06 DIAGNOSIS — M1991 Primary osteoarthritis, unspecified site: Secondary | ICD-10-CM | POA: Diagnosis not present

## 2022-05-06 DIAGNOSIS — G478 Other sleep disorders: Secondary | ICD-10-CM | POA: Diagnosis not present

## 2022-05-06 DIAGNOSIS — Z791 Long term (current) use of non-steroidal anti-inflammatories (NSAID): Secondary | ICD-10-CM | POA: Diagnosis not present

## 2022-05-06 DIAGNOSIS — R519 Headache, unspecified: Secondary | ICD-10-CM | POA: Diagnosis not present

## 2022-05-06 DIAGNOSIS — H209 Unspecified iridocyclitis: Secondary | ICD-10-CM | POA: Diagnosis not present

## 2022-05-06 DIAGNOSIS — R0683 Snoring: Secondary | ICD-10-CM | POA: Diagnosis not present

## 2022-05-18 DIAGNOSIS — Z743 Need for continuous supervision: Secondary | ICD-10-CM | POA: Diagnosis not present

## 2022-05-18 DIAGNOSIS — S8252XA Displaced fracture of medial malleolus of left tibia, initial encounter for closed fracture: Secondary | ICD-10-CM | POA: Diagnosis not present

## 2022-05-18 DIAGNOSIS — S8262XA Displaced fracture of lateral malleolus of left fibula, initial encounter for closed fracture: Secondary | ICD-10-CM | POA: Diagnosis not present

## 2022-05-18 DIAGNOSIS — S82892A Other fracture of left lower leg, initial encounter for closed fracture: Secondary | ICD-10-CM | POA: Diagnosis not present

## 2022-05-18 DIAGNOSIS — M7989 Other specified soft tissue disorders: Secondary | ICD-10-CM | POA: Diagnosis not present

## 2022-05-18 DIAGNOSIS — S99912A Unspecified injury of left ankle, initial encounter: Secondary | ICD-10-CM | POA: Diagnosis not present

## 2022-05-18 DIAGNOSIS — S82852A Displaced trimalleolar fracture of left lower leg, initial encounter for closed fracture: Secondary | ICD-10-CM | POA: Diagnosis not present

## 2022-05-18 DIAGNOSIS — S9305XA Dislocation of left ankle joint, initial encounter: Secondary | ICD-10-CM | POA: Diagnosis not present

## 2022-05-19 DIAGNOSIS — S82842A Displaced bimalleolar fracture of left lower leg, initial encounter for closed fracture: Secondary | ICD-10-CM | POA: Diagnosis not present

## 2022-05-25 DIAGNOSIS — Z79899 Other long term (current) drug therapy: Secondary | ICD-10-CM | POA: Diagnosis not present

## 2022-05-25 DIAGNOSIS — S82842A Displaced bimalleolar fracture of left lower leg, initial encounter for closed fracture: Secondary | ICD-10-CM | POA: Diagnosis not present

## 2022-05-25 DIAGNOSIS — I10 Essential (primary) hypertension: Secondary | ICD-10-CM | POA: Diagnosis not present

## 2022-05-25 DIAGNOSIS — S82852A Displaced trimalleolar fracture of left lower leg, initial encounter for closed fracture: Secondary | ICD-10-CM | POA: Diagnosis not present

## 2022-05-25 DIAGNOSIS — K219 Gastro-esophageal reflux disease without esophagitis: Secondary | ICD-10-CM | POA: Diagnosis not present

## 2022-05-25 DIAGNOSIS — S82843A Displaced bimalleolar fracture of unspecified lower leg, initial encounter for closed fracture: Secondary | ICD-10-CM | POA: Diagnosis not present

## 2022-05-25 DIAGNOSIS — G8918 Other acute postprocedural pain: Secondary | ICD-10-CM | POA: Diagnosis not present

## 2022-06-09 DIAGNOSIS — S82842A Displaced bimalleolar fracture of left lower leg, initial encounter for closed fracture: Secondary | ICD-10-CM | POA: Diagnosis not present

## 2022-07-07 DIAGNOSIS — S82842A Displaced bimalleolar fracture of left lower leg, initial encounter for closed fracture: Secondary | ICD-10-CM | POA: Diagnosis not present

## 2022-08-19 DIAGNOSIS — Z961 Presence of intraocular lens: Secondary | ICD-10-CM | POA: Diagnosis not present

## 2022-08-19 DIAGNOSIS — H401131 Primary open-angle glaucoma, bilateral, mild stage: Secondary | ICD-10-CM | POA: Diagnosis not present

## 2022-08-19 DIAGNOSIS — H52203 Unspecified astigmatism, bilateral: Secondary | ICD-10-CM | POA: Diagnosis not present

## 2022-09-07 DIAGNOSIS — M1990 Unspecified osteoarthritis, unspecified site: Secondary | ICD-10-CM | POA: Diagnosis not present

## 2022-09-07 DIAGNOSIS — M810 Age-related osteoporosis without current pathological fracture: Secondary | ICD-10-CM | POA: Diagnosis not present

## 2022-09-07 DIAGNOSIS — R7303 Prediabetes: Secondary | ICD-10-CM | POA: Diagnosis not present

## 2022-11-20 DIAGNOSIS — M8580 Other specified disorders of bone density and structure, unspecified site: Secondary | ICD-10-CM | POA: Diagnosis not present

## 2022-11-20 DIAGNOSIS — M8589 Other specified disorders of bone density and structure, multiple sites: Secondary | ICD-10-CM | POA: Diagnosis not present

## 2022-12-06 ENCOUNTER — Other Ambulatory Visit: Payer: Self-pay | Admitting: Gastroenterology

## 2023-03-10 DIAGNOSIS — Z791 Long term (current) use of non-steroidal anti-inflammatories (NSAID): Secondary | ICD-10-CM | POA: Diagnosis not present

## 2023-03-10 DIAGNOSIS — R768 Other specified abnormal immunological findings in serum: Secondary | ICD-10-CM | POA: Diagnosis not present

## 2023-03-10 DIAGNOSIS — M5431 Sciatica, right side: Secondary | ICD-10-CM | POA: Diagnosis not present

## 2023-03-10 DIAGNOSIS — Z79899 Other long term (current) drug therapy: Secondary | ICD-10-CM | POA: Diagnosis not present

## 2023-03-10 DIAGNOSIS — H209 Unspecified iridocyclitis: Secondary | ICD-10-CM | POA: Diagnosis not present

## 2023-03-10 DIAGNOSIS — H401131 Primary open-angle glaucoma, bilateral, mild stage: Secondary | ICD-10-CM | POA: Diagnosis not present

## 2023-03-10 DIAGNOSIS — E790 Hyperuricemia without signs of inflammatory arthritis and tophaceous disease: Secondary | ICD-10-CM | POA: Diagnosis not present

## 2023-03-10 DIAGNOSIS — M1991 Primary osteoarthritis, unspecified site: Secondary | ICD-10-CM | POA: Diagnosis not present

## 2023-03-15 DIAGNOSIS — E042 Nontoxic multinodular goiter: Secondary | ICD-10-CM | POA: Diagnosis not present

## 2023-03-15 DIAGNOSIS — E559 Vitamin D deficiency, unspecified: Secondary | ICD-10-CM | POA: Diagnosis not present

## 2023-03-15 DIAGNOSIS — M810 Age-related osteoporosis without current pathological fracture: Secondary | ICD-10-CM | POA: Diagnosis not present

## 2023-03-15 DIAGNOSIS — I10 Essential (primary) hypertension: Secondary | ICD-10-CM | POA: Diagnosis not present

## 2023-03-15 DIAGNOSIS — M1990 Unspecified osteoarthritis, unspecified site: Secondary | ICD-10-CM | POA: Diagnosis not present

## 2023-03-15 DIAGNOSIS — G894 Chronic pain syndrome: Secondary | ICD-10-CM | POA: Diagnosis not present

## 2023-03-15 DIAGNOSIS — K219 Gastro-esophageal reflux disease without esophagitis: Secondary | ICD-10-CM | POA: Diagnosis not present

## 2023-03-15 DIAGNOSIS — R7303 Prediabetes: Secondary | ICD-10-CM | POA: Diagnosis not present

## 2023-03-15 DIAGNOSIS — E782 Mixed hyperlipidemia: Secondary | ICD-10-CM | POA: Diagnosis not present

## 2023-04-22 ENCOUNTER — Other Ambulatory Visit: Payer: Self-pay | Admitting: Family Medicine

## 2023-04-22 DIAGNOSIS — Z1231 Encounter for screening mammogram for malignant neoplasm of breast: Secondary | ICD-10-CM

## 2023-04-27 DIAGNOSIS — B029 Zoster without complications: Secondary | ICD-10-CM | POA: Diagnosis not present

## 2023-05-05 ENCOUNTER — Ambulatory Visit: Payer: Medicare Other

## 2023-05-26 DIAGNOSIS — R928 Other abnormal and inconclusive findings on diagnostic imaging of breast: Secondary | ICD-10-CM | POA: Diagnosis not present

## 2023-05-26 DIAGNOSIS — Z1231 Encounter for screening mammogram for malignant neoplasm of breast: Secondary | ICD-10-CM | POA: Diagnosis not present

## 2023-06-21 DIAGNOSIS — N6311 Unspecified lump in the right breast, upper outer quadrant: Secondary | ICD-10-CM | POA: Diagnosis not present

## 2023-06-21 DIAGNOSIS — R92323 Mammographic fibroglandular density, bilateral breasts: Secondary | ICD-10-CM | POA: Diagnosis not present

## 2023-06-21 DIAGNOSIS — R928 Other abnormal and inconclusive findings on diagnostic imaging of breast: Secondary | ICD-10-CM | POA: Diagnosis not present

## 2023-07-02 ENCOUNTER — Encounter: Payer: Self-pay | Admitting: Gastroenterology

## 2023-08-03 IMAGING — MG MM DIGITAL SCREENING BILAT W/ TOMO AND CAD
6 of 10 series · 6 of 30 positions shown · non-contrast
Comparison: Previous exam(s).

CLINICAL DATA: Screening.

EXAM:
DIGITAL SCREENING BILATERAL MAMMOGRAM WITH TOMOSYNTHESIS AND CAD
TECHNIQUE: Bilateral screening digital craniocaudal and mediolateral oblique
mammograms were obtained. Bilateral screening digital breast
tomosynthesis was performed. The images were evaluated with
computer-aided detection.

[R MLO synth-2D (1 of 2)]
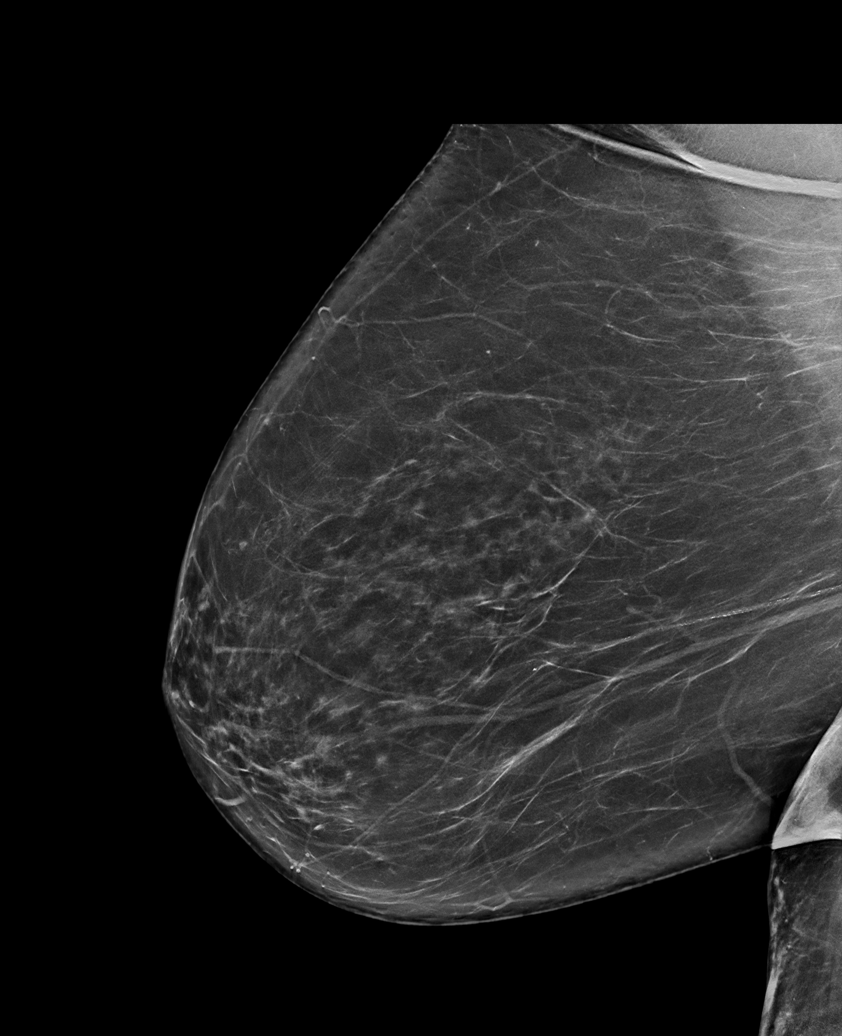

[L CC synth-2D]
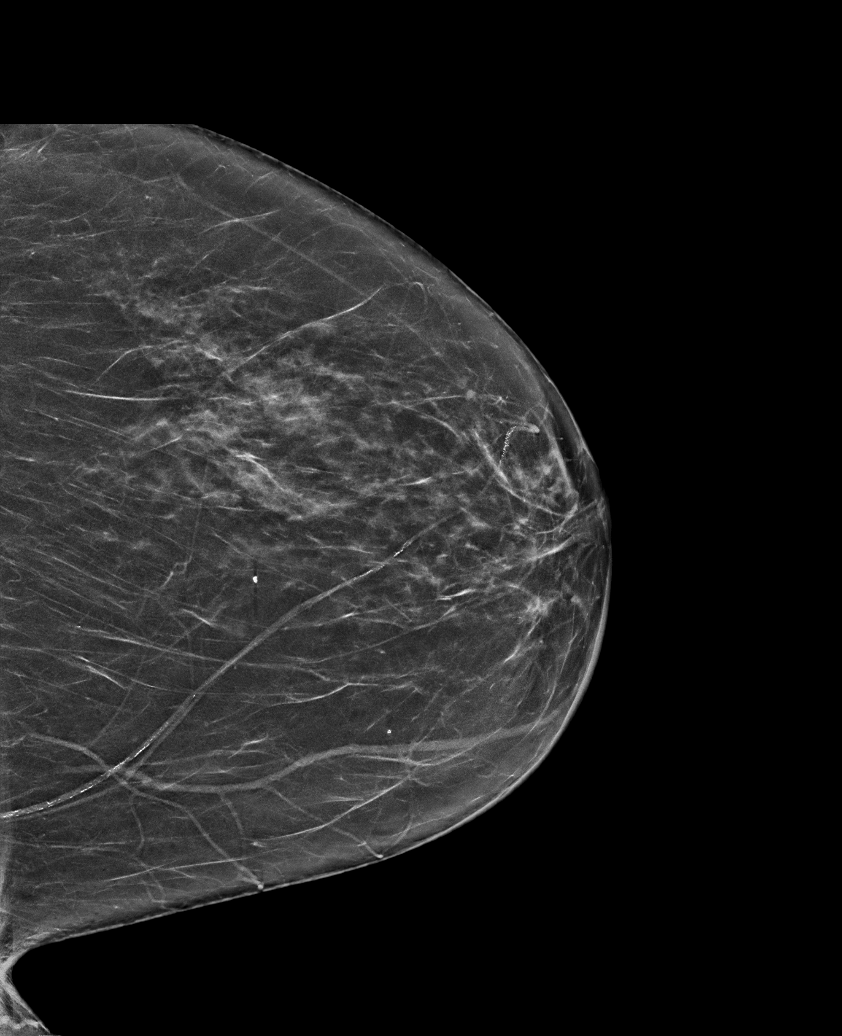

[L MLO synth-2D]
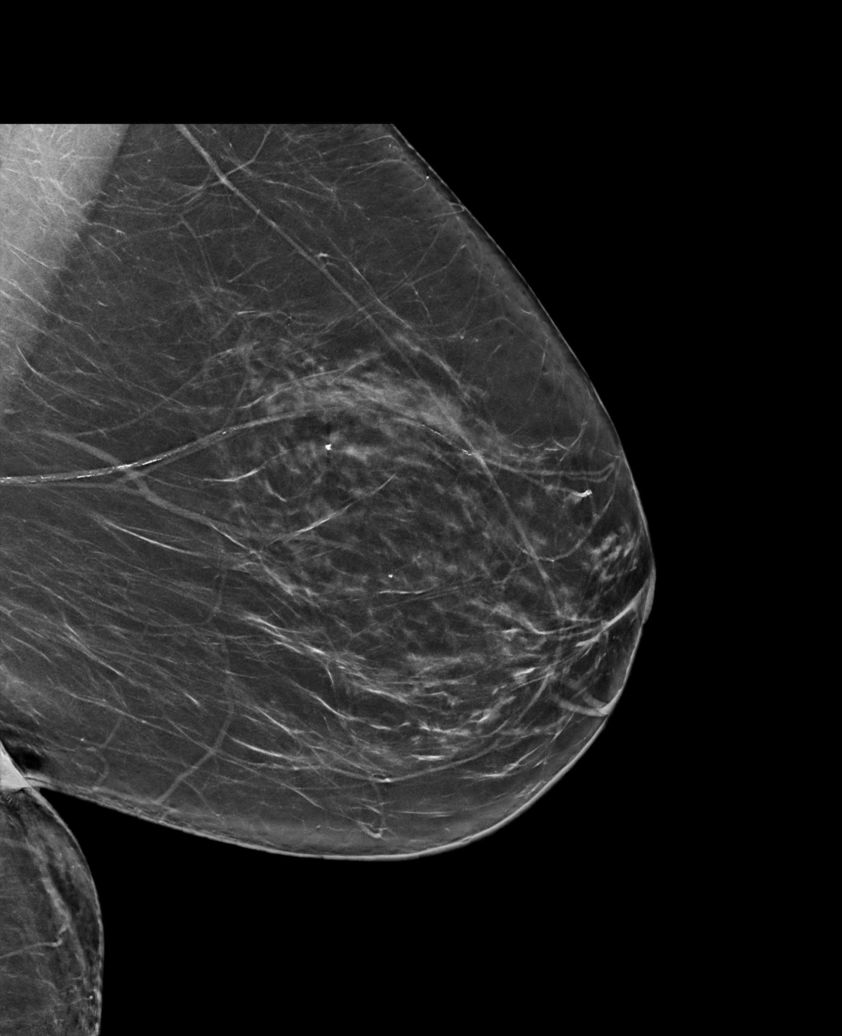

[R MLO synth-2D (2 of 2)]
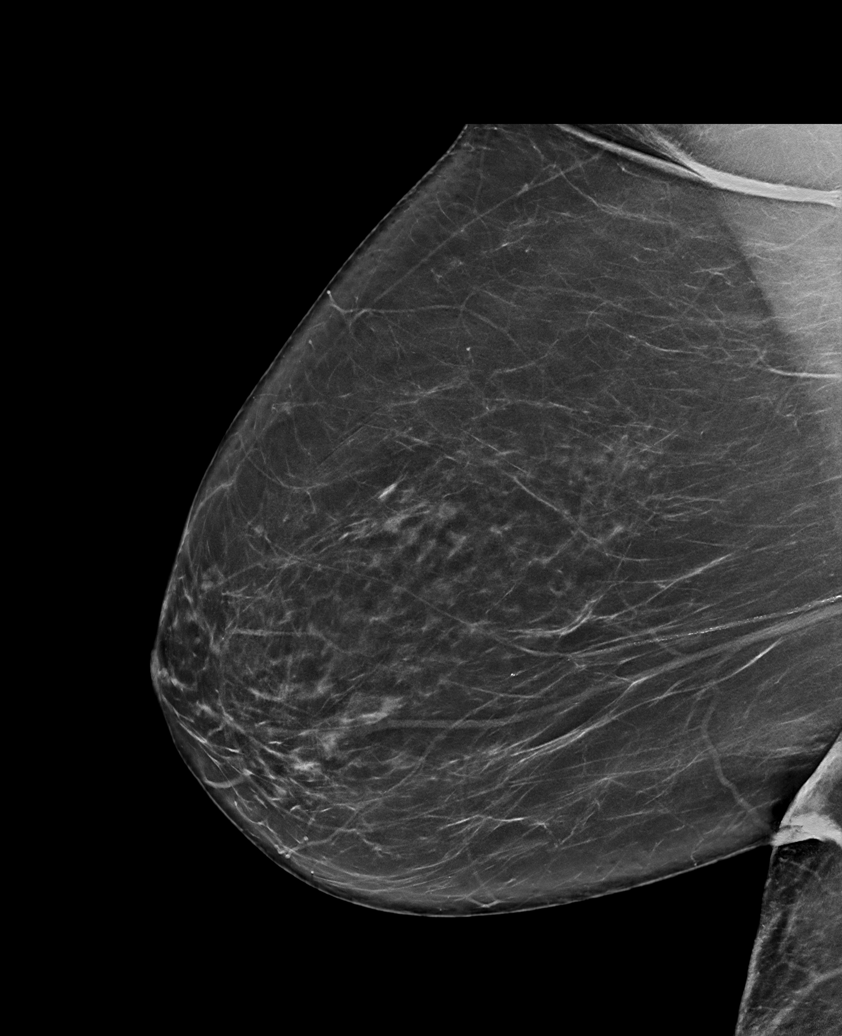

[R CC synth-2D]
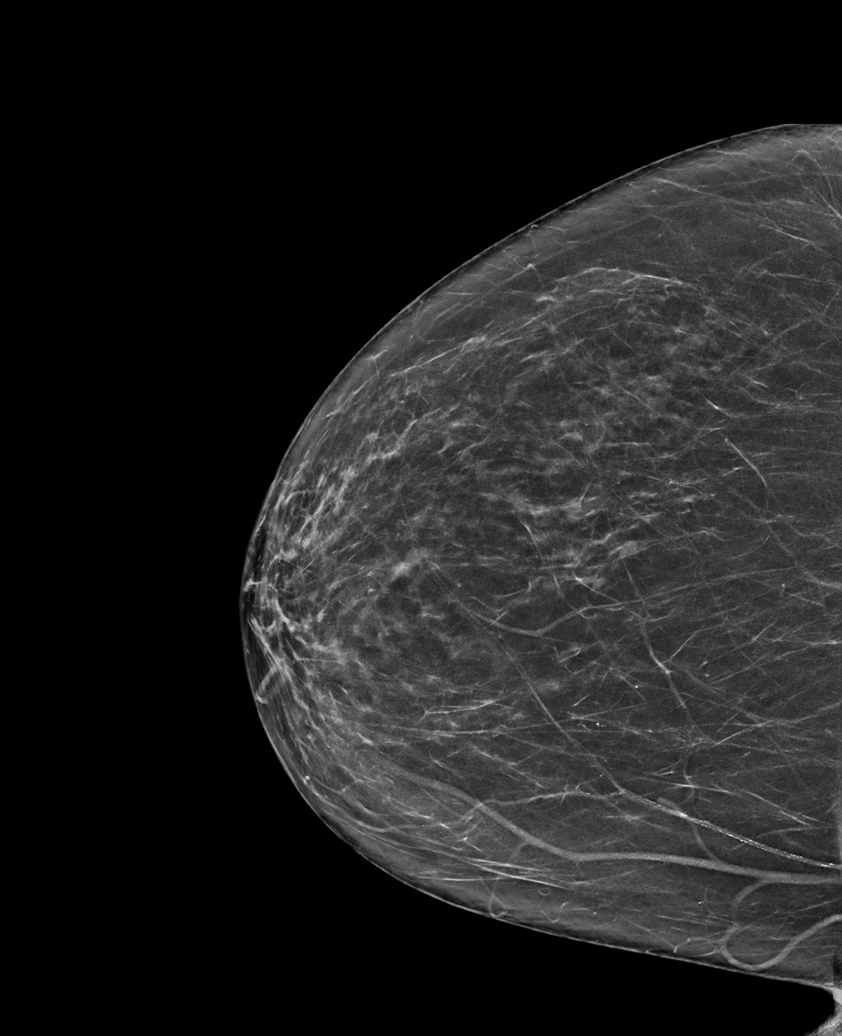

[L MLO tomo · tomo slice 38/75.0]
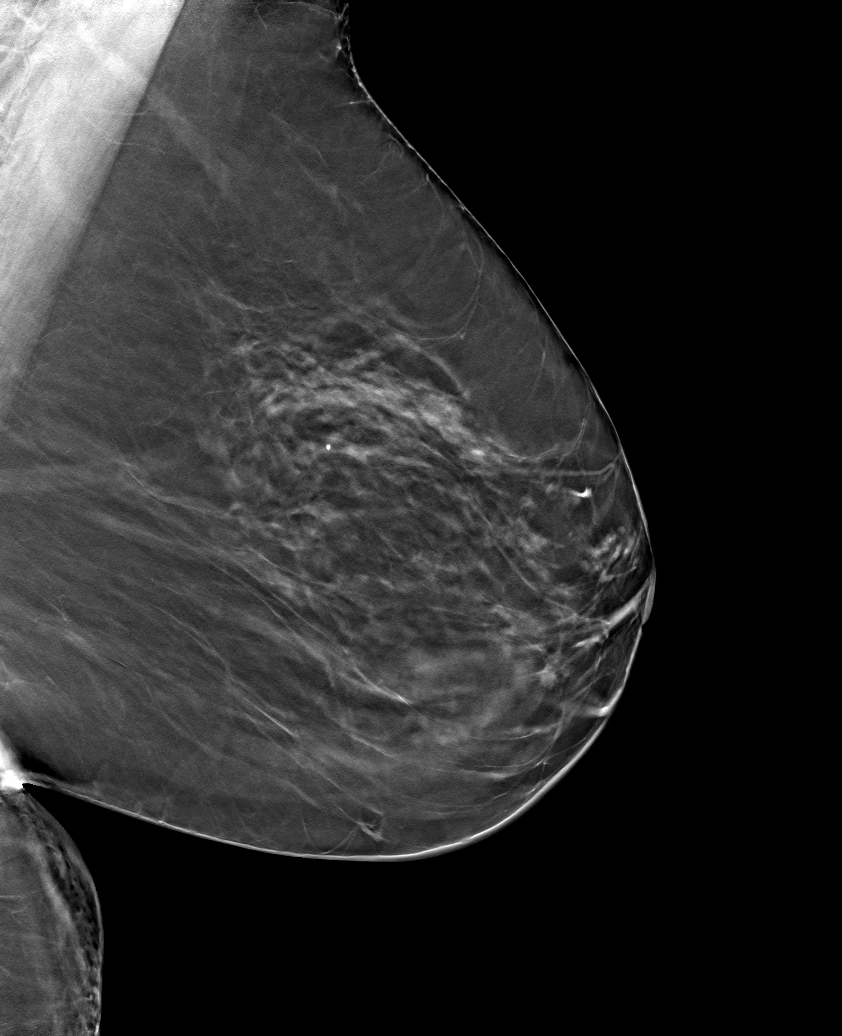

[6 of 30 positions shown; findings below may reference images not displayed]

ACR Breast Density Category b: There are scattered areas of
fibroglandular density.
FINDINGS: There are no findings suspicious for malignancy.
IMPRESSION: No mammographic evidence of malignancy. A result letter of this
screening mammogram will be mailed directly to the patient.

RECOMMENDATION:
Screening mammogram in one year. (Code:51-O-LD2)

BI-RADS CATEGORY  1: Negative.

## 2023-10-21 DIAGNOSIS — R7303 Prediabetes: Secondary | ICD-10-CM | POA: Diagnosis not present

## 2023-10-21 DIAGNOSIS — G894 Chronic pain syndrome: Secondary | ICD-10-CM | POA: Diagnosis not present

## 2023-10-21 DIAGNOSIS — I10 Essential (primary) hypertension: Secondary | ICD-10-CM | POA: Diagnosis not present

## 2023-10-21 DIAGNOSIS — E782 Mixed hyperlipidemia: Secondary | ICD-10-CM | POA: Diagnosis not present

## 2023-10-21 DIAGNOSIS — M810 Age-related osteoporosis without current pathological fracture: Secondary | ICD-10-CM | POA: Diagnosis not present

## 2023-10-27 DIAGNOSIS — H401131 Primary open-angle glaucoma, bilateral, mild stage: Secondary | ICD-10-CM | POA: Diagnosis not present

## 2023-10-27 DIAGNOSIS — H52203 Unspecified astigmatism, bilateral: Secondary | ICD-10-CM | POA: Diagnosis not present

## 2023-10-27 DIAGNOSIS — Z961 Presence of intraocular lens: Secondary | ICD-10-CM | POA: Diagnosis not present

## 2023-11-02 ENCOUNTER — Ambulatory Visit: Payer: Medicare Other | Admitting: Gastroenterology

## 2023-12-01 DIAGNOSIS — H209 Unspecified iridocyclitis: Secondary | ICD-10-CM | POA: Diagnosis not present

## 2023-12-01 DIAGNOSIS — M1991 Primary osteoarthritis, unspecified site: Secondary | ICD-10-CM | POA: Diagnosis not present

## 2023-12-01 DIAGNOSIS — R768 Other specified abnormal immunological findings in serum: Secondary | ICD-10-CM | POA: Diagnosis not present

## 2023-12-01 DIAGNOSIS — E790 Hyperuricemia without signs of inflammatory arthritis and tophaceous disease: Secondary | ICD-10-CM | POA: Diagnosis not present

## 2023-12-01 DIAGNOSIS — Z791 Long term (current) use of non-steroidal anti-inflammatories (NSAID): Secondary | ICD-10-CM | POA: Diagnosis not present

## 2024-01-04 DIAGNOSIS — R1310 Dysphagia, unspecified: Secondary | ICD-10-CM | POA: Diagnosis not present

## 2024-01-04 DIAGNOSIS — K219 Gastro-esophageal reflux disease without esophagitis: Secondary | ICD-10-CM | POA: Diagnosis not present

## 2024-01-05 DIAGNOSIS — N6310 Unspecified lump in the right breast, unspecified quadrant: Secondary | ICD-10-CM | POA: Diagnosis not present

## 2024-01-05 DIAGNOSIS — N6311 Unspecified lump in the right breast, upper outer quadrant: Secondary | ICD-10-CM | POA: Diagnosis not present

## 2024-03-23 DIAGNOSIS — Z881 Allergy status to other antibiotic agents status: Secondary | ICD-10-CM | POA: Diagnosis not present

## 2024-03-23 DIAGNOSIS — K449 Diaphragmatic hernia without obstruction or gangrene: Secondary | ICD-10-CM | POA: Diagnosis not present

## 2024-03-23 DIAGNOSIS — R194 Change in bowel habit: Secondary | ICD-10-CM | POA: Diagnosis not present

## 2024-03-23 DIAGNOSIS — I341 Nonrheumatic mitral (valve) prolapse: Secondary | ICD-10-CM | POA: Diagnosis not present

## 2024-03-23 DIAGNOSIS — E785 Hyperlipidemia, unspecified: Secondary | ICD-10-CM | POA: Diagnosis not present

## 2024-03-23 DIAGNOSIS — K5730 Diverticulosis of large intestine without perforation or abscess without bleeding: Secondary | ICD-10-CM | POA: Diagnosis not present

## 2024-03-23 DIAGNOSIS — D126 Benign neoplasm of colon, unspecified: Secondary | ICD-10-CM | POA: Diagnosis not present

## 2024-03-23 DIAGNOSIS — K219 Gastro-esophageal reflux disease without esophagitis: Secondary | ICD-10-CM | POA: Diagnosis not present

## 2024-03-23 DIAGNOSIS — M1990 Unspecified osteoarthritis, unspecified site: Secondary | ICD-10-CM | POA: Diagnosis not present

## 2024-03-23 DIAGNOSIS — Z860100 Personal history of colon polyps, unspecified: Secondary | ICD-10-CM | POA: Diagnosis not present

## 2024-03-23 DIAGNOSIS — Z1211 Encounter for screening for malignant neoplasm of colon: Secondary | ICD-10-CM | POA: Diagnosis not present

## 2024-03-23 DIAGNOSIS — I10 Essential (primary) hypertension: Secondary | ICD-10-CM | POA: Diagnosis not present

## 2024-03-23 DIAGNOSIS — R1310 Dysphagia, unspecified: Secondary | ICD-10-CM | POA: Diagnosis not present

## 2024-03-23 DIAGNOSIS — Z8711 Personal history of peptic ulcer disease: Secondary | ICD-10-CM | POA: Diagnosis not present

## 2024-03-23 DIAGNOSIS — K222 Esophageal obstruction: Secondary | ICD-10-CM | POA: Diagnosis not present

## 2024-03-23 DIAGNOSIS — Z888 Allergy status to other drugs, medicaments and biological substances status: Secondary | ICD-10-CM | POA: Diagnosis not present

## 2024-03-23 DIAGNOSIS — K644 Residual hemorrhoidal skin tags: Secondary | ICD-10-CM | POA: Diagnosis not present

## 2024-03-23 DIAGNOSIS — R1011 Right upper quadrant pain: Secondary | ICD-10-CM | POA: Diagnosis not present

## 2024-03-23 DIAGNOSIS — K635 Polyp of colon: Secondary | ICD-10-CM | POA: Diagnosis not present

## 2024-04-17 DIAGNOSIS — R7303 Prediabetes: Secondary | ICD-10-CM | POA: Diagnosis not present

## 2024-04-17 DIAGNOSIS — Z23 Encounter for immunization: Secondary | ICD-10-CM | POA: Diagnosis not present

## 2024-04-17 DIAGNOSIS — E782 Mixed hyperlipidemia: Secondary | ICD-10-CM | POA: Diagnosis not present

## 2024-04-17 DIAGNOSIS — G629 Polyneuropathy, unspecified: Secondary | ICD-10-CM | POA: Diagnosis not present

## 2024-04-17 DIAGNOSIS — I10 Essential (primary) hypertension: Secondary | ICD-10-CM | POA: Diagnosis not present

## 2024-04-17 DIAGNOSIS — M1990 Unspecified osteoarthritis, unspecified site: Secondary | ICD-10-CM | POA: Diagnosis not present

## 2024-04-17 DIAGNOSIS — M810 Age-related osteoporosis without current pathological fracture: Secondary | ICD-10-CM | POA: Diagnosis not present

## 2024-04-20 ENCOUNTER — Other Ambulatory Visit (HOSPITAL_BASED_OUTPATIENT_CLINIC_OR_DEPARTMENT_OTHER): Payer: Self-pay | Admitting: Family Medicine

## 2024-04-20 DIAGNOSIS — E782 Mixed hyperlipidemia: Secondary | ICD-10-CM

## 2024-04-24 ENCOUNTER — Ambulatory Visit (HOSPITAL_BASED_OUTPATIENT_CLINIC_OR_DEPARTMENT_OTHER)
Admission: RE | Admit: 2024-04-24 | Discharge: 2024-04-24 | Disposition: A | Discharge status: Home / Self Care | Payer: Self-pay | Source: Ambulatory Visit | Attending: Family Medicine | Admitting: Family Medicine

## 2024-04-24 DIAGNOSIS — E782 Mixed hyperlipidemia: Secondary | ICD-10-CM

## 2024-04-25 DIAGNOSIS — H401131 Primary open-angle glaucoma, bilateral, mild stage: Secondary | ICD-10-CM | POA: Diagnosis not present

## 2024-04-27 DIAGNOSIS — M1990 Unspecified osteoarthritis, unspecified site: Secondary | ICD-10-CM | POA: Diagnosis not present

## 2024-04-27 DIAGNOSIS — M810 Age-related osteoporosis without current pathological fracture: Secondary | ICD-10-CM | POA: Diagnosis not present

## 2024-04-27 DIAGNOSIS — E782 Mixed hyperlipidemia: Secondary | ICD-10-CM | POA: Diagnosis not present

## 2024-05-28 DIAGNOSIS — E782 Mixed hyperlipidemia: Secondary | ICD-10-CM | POA: Diagnosis not present

## 2024-05-28 DIAGNOSIS — M810 Age-related osteoporosis without current pathological fracture: Secondary | ICD-10-CM | POA: Diagnosis not present

## 2024-05-28 DIAGNOSIS — M1990 Unspecified osteoarthritis, unspecified site: Secondary | ICD-10-CM | POA: Diagnosis not present
